# Patient Record
Sex: Female | Born: 1990 | Race: White | Hispanic: No | Marital: Married | Smoking: Never smoker
Health system: Southern US, Community
[De-identification: ages and names within clinical notes are randomized; demographics above are authoritative.]

## PROBLEM LIST (undated history)

## (undated) ENCOUNTER — Inpatient Hospital Stay (HOSPITAL_COMMUNITY): Payer: Self-pay

## (undated) DIAGNOSIS — F410 Panic disorder [episodic paroxysmal anxiety] without agoraphobia: Secondary | ICD-10-CM

## (undated) DIAGNOSIS — F411 Generalized anxiety disorder: Principal | ICD-10-CM

## (undated) DIAGNOSIS — F329 Major depressive disorder, single episode, unspecified: Secondary | ICD-10-CM

## (undated) HISTORY — PX: NO PAST SURGERIES: SHX2092

## (undated) HISTORY — DX: Panic disorder (episodic paroxysmal anxiety): F41.0

## (undated) HISTORY — DX: Major depressive disorder, single episode, unspecified: F32.9

## (undated) HISTORY — DX: Generalized anxiety disorder: F41.1

---
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
## 2011-12-10 ENCOUNTER — Ambulatory Visit (INDEPENDENT_AMBULATORY_CARE_PROVIDER_SITE_OTHER): Payer: BC Managed Care – PPO | Admitting: Family Medicine

## 2011-12-10 ENCOUNTER — Encounter: Payer: Self-pay | Admitting: Family Medicine

## 2011-12-10 VITALS — BP 122/78 | HR 95 | Temp 98.4°F | Wt 141.0 lb

## 2011-12-10 DIAGNOSIS — F410 Panic disorder [episodic paroxysmal anxiety] without agoraphobia: Secondary | ICD-10-CM

## 2011-12-10 DIAGNOSIS — R002 Palpitations: Secondary | ICD-10-CM

## 2011-12-10 LAB — TSH: TSH: 0.89 u[IU]/mL (ref 0.35–5.50)

## 2011-12-10 MED ORDER — PAROXETINE HCL 20 MG PO TABS: 20.0000 mg | ORAL_TABLET | ORAL | Status: DC

## 2011-12-10 NOTE — Patient Instructions (Signed)
Start taking the paxil and call back if not improved over the next few weeks.   I think counseling would be a good idea.  Ask about seeing Dr. Laymond Purser.  Go to the lab on the way out.   You can get your results through our phone system.  Follow the instructions on the blue card.

## 2011-12-10 NOTE — Progress Notes (Signed)
Episodes have happened since last year.  Was seen at ER prev, was thought to have panic episodes. She'll have episodic B hand tingling, hyperventilation, and heart racing. She'll feel light headed.  She had another episode this AM but it resolved.    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  She wants to go for BSN.  She's at [facility] now, living at home.  Her boyfriend has cancer.  She is feeling a lot of family pressure.  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  She had brief counseling for that episode.    No Si/Hi.    FH: Mother with h/o panic/anxiety sx.    PMH and SH reviewed  ROS: See HPI, otherwise noncontributory.  Meds, vitals, and allergies reviewed.   GEN: nad, alert and oriented HEENT: mucous membranes moist NECK: supple w/o LA CV: rrr.  no murmur PULM: ctab, no inc wob ABD: soft, +bs EXT: no edema SKIN: no acute rash Affect wnl.  Speech wnl.    EKG and TSH wnl.

## 2011-12-11 ENCOUNTER — Encounter: Payer: Self-pay | Admitting: Family Medicine

## 2011-12-11 NOTE — Assessment & Plan Note (Signed)
>  25 min spent with face to face with patient, >50% counseling and/or coordinating care.  EKG wnl, TSH wnl.  Panic is likely.  D/w pt about options.  Encouraged counseling and start paxil.  Routine instructions and timeline given, call back with update in next few weeks if needed.  She agrees.  Okay for outpatient fu.

## 2011-12-25 ENCOUNTER — Telehealth: Payer: Self-pay | Admitting: *Deleted

## 2011-12-25 NOTE — Telephone Encounter (Signed)
Agreed -

## 2011-12-25 NOTE — Telephone Encounter (Signed)
Patient came in today stating that her BP was elevated, I asked patient what are some of her BP readings and she stated that she doesn't have any she just feels that her BP is high.  Checked BP today and it is 142/82 pulse 76.  Spoke with Dr. Para March and he stated that he wants patient to check her BP over the next several days in the morning and call us back with readings.  Patient advised as instructed, she stated that she does have a BP cuff at home and will check her BP.

## 2011-12-26 ENCOUNTER — Ambulatory Visit: Payer: BC Managed Care – PPO | Admitting: Psychology

## 2012-01-09 ENCOUNTER — Ambulatory Visit (INDEPENDENT_AMBULATORY_CARE_PROVIDER_SITE_OTHER): Payer: BC Managed Care – PPO | Admitting: Psychology

## 2012-01-09 DIAGNOSIS — F411 Generalized anxiety disorder: Secondary | ICD-10-CM

## 2012-01-30 ENCOUNTER — Ambulatory Visit (INDEPENDENT_AMBULATORY_CARE_PROVIDER_SITE_OTHER): Payer: BC Managed Care – PPO | Admitting: Psychology

## 2012-01-30 DIAGNOSIS — F411 Generalized anxiety disorder: Secondary | ICD-10-CM

## 2012-01-31 ENCOUNTER — Encounter: Payer: Self-pay | Admitting: *Deleted

## 2012-02-13 ENCOUNTER — Ambulatory Visit: Payer: BC Managed Care – PPO | Admitting: Psychology

## 2012-02-20 ENCOUNTER — Ambulatory Visit: Payer: BC Managed Care – PPO | Admitting: Psychology

## 2012-02-27 ENCOUNTER — Ambulatory Visit (INDEPENDENT_AMBULATORY_CARE_PROVIDER_SITE_OTHER): Payer: BC Managed Care – PPO | Admitting: Psychology

## 2012-02-27 DIAGNOSIS — F411 Generalized anxiety disorder: Secondary | ICD-10-CM

## 2012-03-04 ENCOUNTER — Other Ambulatory Visit: Payer: BC Managed Care – PPO

## 2012-03-05 ENCOUNTER — Ambulatory Visit: Payer: BC Managed Care – PPO | Admitting: Psychology

## 2012-03-05 ENCOUNTER — Ambulatory Visit (INDEPENDENT_AMBULATORY_CARE_PROVIDER_SITE_OTHER): Payer: BC Managed Care – PPO | Admitting: *Deleted

## 2012-03-05 DIAGNOSIS — Z111 Encounter for screening for respiratory tuberculosis: Secondary | ICD-10-CM

## 2012-03-07 LAB — TB SKIN TEST
Induration: 0 mm
TB Skin Test: NEGATIVE

## 2012-03-19 ENCOUNTER — Ambulatory Visit (INDEPENDENT_AMBULATORY_CARE_PROVIDER_SITE_OTHER): Payer: BC Managed Care – PPO | Admitting: Psychology

## 2012-03-19 DIAGNOSIS — F411 Generalized anxiety disorder: Secondary | ICD-10-CM

## 2012-04-15 ENCOUNTER — Ambulatory Visit: Payer: BC Managed Care – PPO | Admitting: Psychology

## 2012-06-03 ENCOUNTER — Ambulatory Visit (INDEPENDENT_AMBULATORY_CARE_PROVIDER_SITE_OTHER): Payer: BC Managed Care – PPO | Admitting: Psychology

## 2012-06-03 DIAGNOSIS — F411 Generalized anxiety disorder: Secondary | ICD-10-CM

## 2012-06-17 ENCOUNTER — Ambulatory Visit (INDEPENDENT_AMBULATORY_CARE_PROVIDER_SITE_OTHER): Payer: BC Managed Care – PPO | Admitting: Psychology

## 2012-06-17 DIAGNOSIS — F411 Generalized anxiety disorder: Secondary | ICD-10-CM

## 2012-07-02 ENCOUNTER — Ambulatory Visit: Payer: BC Managed Care – PPO | Admitting: Psychology

## 2012-08-05 ENCOUNTER — Ambulatory Visit: Payer: Self-pay | Admitting: Psychology

## 2012-08-20 ENCOUNTER — Ambulatory Visit (INDEPENDENT_AMBULATORY_CARE_PROVIDER_SITE_OTHER): Payer: BC Managed Care – PPO | Admitting: Psychology

## 2012-08-20 DIAGNOSIS — F411 Generalized anxiety disorder: Secondary | ICD-10-CM

## 2012-09-16 ENCOUNTER — Ambulatory Visit: Payer: BC Managed Care – PPO | Admitting: Psychology

## 2012-09-25 ENCOUNTER — Encounter: Payer: Self-pay | Admitting: *Deleted

## 2012-09-25 ENCOUNTER — Encounter: Payer: Self-pay | Admitting: Family Medicine

## 2012-09-25 ENCOUNTER — Ambulatory Visit (INDEPENDENT_AMBULATORY_CARE_PROVIDER_SITE_OTHER): Payer: BC Managed Care – PPO | Admitting: Family Medicine

## 2012-09-25 VITALS — BP 118/70 | HR 76 | Temp 98.2°F | Wt 148.2 lb

## 2012-09-25 DIAGNOSIS — R51 Headache: Secondary | ICD-10-CM

## 2012-09-25 DIAGNOSIS — R519 Headache, unspecified: Secondary | ICD-10-CM | POA: Insufficient documentation

## 2012-09-25 MED ORDER — FLUTICASONE PROPIONATE 50 MCG/ACT NA SUSP: 2.0000 | Freq: Every day | NASAL | Status: DC

## 2012-09-25 NOTE — Patient Instructions (Addendum)
I do think this is related to sinus congestion - treat with nasal saline and nasal steroid (sent to pharmacy) and over the counter ibuprofen 400mg  2 times daily with food for 3-5 days. If not better with this, let us know.

## 2012-09-25 NOTE — Assessment & Plan Note (Signed)
nonfocal neurological exam. Could very well be side effect to paxil, however, anticipate more of a sinus pressure headache from sinus congestion/sinusitis. Given short duration of congestion sxs, anticipate viral sinusitis.  Discussed this. Treat with nasal saline, nasal steroid, and OTC ibuprofen for next 3-5 days. Discussed if HA continued despite this, to update [place], consider switch to another SSRI. Pt agrees with plan.

## 2012-09-25 NOTE — Progress Notes (Signed)
  Subjective:    Patient ID: Laura Gamble, female    DOB: 10/25/1990, 21 y.o.   MRN: [id number]  HPI CC: HA  Pleasant 21 yo with h/o panic attacks on paxil presents with 3 wk h/o HA, and 4d h/o worsening headaches described as bilateral frontal throbbing ache or pressure.  + photophobia.  No nausea/vomiting.  Go away on their own.  Relaxing in dark quiet area helps.  Doesn't tale any meds for HA.  Sometimes activity modifying headaches.  Not really worsening.  Doesn't awaken with them.  No h/o migraines in past.  Doesn't think headaches related to cycle.  Thinks staying well hydrated and getting plenty of sleep.  Drinks 2-3 cups caffeine /day but also a lot of water.  Denies fevers/chills, neck pain, vision changes.  Thinks paxil causing worsening migraines because seems to get HA whenever takes paxil.  Has been on paxil for last year.  Takes every other day (for last 4 months).  Anxiety attacks still present.  Fiance died in 04/29/2023 (cancer).  Pt currently in nursing school, working 2 jobs.  Today awoke with increased coughing, congestion.  + tooth pain.  No PNdrainage, ear pain. Mom recently with sinus infection. 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  Past Medical History  Diagnosis Date  . Panic attack     Review of Systems Per HPI    Objective:   Physical Exam  Nursing note and vitals reviewed. Constitutional: She appears well-developed and well-nourished. No distress.  HENT:  Head: Normocephalic and atraumatic.  Right Ear: Hearing, tympanic membrane, external ear and ear canal normal.  Left Ear: Hearing, tympanic membrane, external ear and ear canal normal.  Nose: No mucosal edema or rhinorrhea. Right sinus exhibits maxillary sinus tenderness (mild pressure). Right sinus exhibits no frontal sinus tenderness. Left sinus exhibits no maxillary sinus tenderness and no frontal sinus tenderness.  Mouth/Throat: Uvula is midline, oropharynx is clear and moist and mucous membranes are normal. No  oropharyngeal exudate, posterior oropharyngeal edema, posterior oropharyngeal erythema or tonsillar abscesses.  Eyes: Conjunctivae normal and EOM are normal. Pupils are equal, round, and reactive to light. No scleral icterus.  Neck: Normal range of motion. Neck supple.  Cardiovascular: Normal rate, regular rhythm, normal heart sounds and intact distal pulses.   No murmur heard. Pulmonary/Chest: Effort normal and breath sounds normal. No respiratory distress. She has no wheezes. She has no rales.  Lymphadenopathy:    She has no cervical adenopathy.  Neurological: No cranial nerve deficit or sensory deficit.       CN 2-12 intact Normal FTN No nystagmus  Skin: Skin is warm and dry. No rash noted.  Psychiatric: She has a normal mood and affect.       Assessment & Plan:

## 2012-10-14 ENCOUNTER — Ambulatory Visit: Payer: BC Managed Care – PPO | Admitting: Psychology

## 2012-10-21 ENCOUNTER — Ambulatory Visit: Payer: BC Managed Care – PPO

## 2012-10-24 ENCOUNTER — Ambulatory Visit (INDEPENDENT_AMBULATORY_CARE_PROVIDER_SITE_OTHER): Payer: BC Managed Care – PPO | Admitting: *Deleted

## 2012-10-24 DIAGNOSIS — Z23 Encounter for immunization: Secondary | ICD-10-CM

## 2012-11-13 ENCOUNTER — Encounter: Payer: Self-pay | Admitting: Family Medicine

## 2012-11-13 ENCOUNTER — Ambulatory Visit (INDEPENDENT_AMBULATORY_CARE_PROVIDER_SITE_OTHER): Payer: BC Managed Care – PPO | Admitting: Family Medicine

## 2012-11-13 ENCOUNTER — Ambulatory Visit: Payer: BC Managed Care – PPO | Admitting: Family Medicine

## 2012-11-13 VITALS — BP 120/60 | HR 101 | Temp 98.3°F | Ht 66.0 in | Wt 150.5 lb

## 2012-11-13 DIAGNOSIS — R599 Enlarged lymph nodes, unspecified: Secondary | ICD-10-CM

## 2012-11-13 DIAGNOSIS — R109 Unspecified abdominal pain: Secondary | ICD-10-CM

## 2012-11-13 DIAGNOSIS — R590 Localized enlarged lymph nodes: Secondary | ICD-10-CM

## 2012-11-13 LAB — POCT URINALYSIS DIPSTICK
Bilirubin, UA: NEGATIVE
Glucose, UA: NEGATIVE
Ketones, UA: NEGATIVE
Nitrite, UA: NEGATIVE
Protein, UA: NEGATIVE
Spec Grav, UA: 1.01
Urobilinogen, UA: NEGATIVE
pH, UA: 7.5

## 2012-11-13 LAB — POCT URINE PREGNANCY: Preg Test, Ur: NEGATIVE

## 2012-11-13 NOTE — Progress Notes (Signed)
Nature conservation officer at [facility] [address] [place] [address] Phone: [phone] Fax: [phone]  Date:  11/13/2012   Name:  Laura Gamble   DOB:  1991/06/24   MRN:  [id number] Gender: female Age: 22 y.o.  Primary Physician:  Hannah Beat, MD  Evaluating MD: Hannah Beat, MD   Chief Complaint: Abdominal Pain   History of Present Illness:  Laura Gamble is a 22 y.o. pleasant patient who presents with the following:  Started on Tuesday and was not all that bad, and yesterday it started to get worse. R LQ.Last night, pain was really bad. This morning it got really bad. No fever. Ate breakfast and lunch. Worked out yesterday. Had some nausea thi smorning and some last night. A little bit constipated. No fever. No chills,. No n/v/d. No bloody stool. No watery diarrhea.   LMP: 4 days ago. Ended days ago. 1 partner, no risk for STD. Boyfriend was virgin when started dating.   Patient Active Problem List  Diagnosis  . Contact Dermatitis and Other Eczema, due to Unspecified Cause  . Panic attack  . Headache    Past Medical History  Diagnosis Date  . Panic attack     No past surgical history on file.  History  Substance Use Topics  . Smoking status: Never Smoker   . Smokeless tobacco: Never Used  . Alcohol Use: No    Family History  Problem Relation Age of Onset  . Anxiety disorder Mother     No Known Allergies  Medication list has been reviewed and updated.  Outpatient Prescriptions Prior to Visit  Medication Sig Dispense Refill  . PARoxetine (PAXIL) 20 MG tablet Take 20 mg by mouth every other day.      . [DISCONTINUED] cimetidine (TAGAMET) 200 MG tablet Take 2 tablets by mouth daily.      . [DISCONTINUED] fluticasone (FLONASE) 50 MCG/ACT nasal spray Place 2 sprays into the nose daily.  16 g  1   Last reviewed on 11/13/2012  2:45 PM by Hannah Beat, MD  Review of Systems:  ROS: GEN: Acute illness details above GI: Tolerating PO  intake GU: maintaining adequate hydration and urination Pulm: No SOB Interactive and getting along well at home.  Otherwise, ROS is as per the HPI.   Physical Examination: BP 120/60  Pulse 101  Temp 98.3 F (36.8 C) (Oral)  Ht 5\' 6"  (1.676 m)  Wt 150 lb 8 oz (68.266 kg)  BMI 24.29 kg/m2  SpO2 99%  Ideal Body Weight: Weight in (lb) to have BMI = 25: 154.6   GEN: WDWN, NAD, Non-toxic, A & O x 3 HEENT: Atraumatic, Normocephalic. Neck supple. No masses, No LAD. Ears and Nose: No external deformity. CV: RRR, No M/G/R. No JVD. No thrill. No extra heart sounds. PULM: CTA B, no wheezes, crackles, rhonchi. No retractions. No resp. distress. No accessory muscle use. ABD: S, NT, ND, +BS. No rebound. No HSM. GU: Chaperoned exam. Normal introitus. Cervix nontender. Uterus and ovaries easily felt, and all nontender.  On the Right inguinal chain, there is an enlarged lymph node that the patient points to as the area of pain. Pubic area is shaven. EXTR: No c/c/e NEURO Normal gait.  PSYCH: Normally interactive. Conversant. Not depressed or anxious appearing.  Calm demeanor.    Assessment and Plan:  1. Lymphadenopathy, inguinal    2. Right sided abdominal pain  POCT Urinalysis Dipstick, POCT urine pregnancy   Reassured - pain is from  inguinal lymph node and should resolve in the next few weeks. Likely from ingrown hair post shaving. She recalls that it started the day after she last shaved.  If worsens and gets red, she is to call and I will put on ABX, but usually none are needed.  Results for orders placed in visit on 11/13/12  POCT URINALYSIS DIPSTICK      Component Value Range   Color, UA yellow     Clarity, UA cloudy     Glucose, UA neg     Bilirubin, UA neg     Ketones, UA neg     Spec Grav, UA 1.010     Blood, UA small     pH, UA 7.5     Protein, UA neg     Urobilinogen, UA negative     Nitrite, UA neg     Leukocytes, UA moderate (2+)    POCT URINE PREGNANCY       Component Value Range   Preg Test, Ur Negative       Orders Today:  Orders Placed This Encounter  Procedures  . POCT Urinalysis Dipstick  . POCT urine pregnancy    Updated Medication List: (Includes new medications, updates to list, dose adjustments) No orders of the defined types were placed in this encounter.    Medications Discontinued: Medications Discontinued During This Encounter  Medication Reason  . fluticasone (FLONASE) 50 MCG/ACT nasal spray Error  . cimetidine (TAGAMET) 200 MG tablet Error     Signed, Tandy Grawe T. Kaya Pottenger, MD 11/13/2012 2:45 PM

## 2013-02-18 ENCOUNTER — Encounter: Payer: BC Managed Care – PPO | Admitting: Family Medicine

## 2013-03-05 ENCOUNTER — Ambulatory Visit (INDEPENDENT_AMBULATORY_CARE_PROVIDER_SITE_OTHER): Payer: BC Managed Care – PPO | Admitting: Family Medicine

## 2013-03-05 ENCOUNTER — Encounter: Payer: Self-pay | Admitting: Family Medicine

## 2013-03-05 ENCOUNTER — Other Ambulatory Visit (HOSPITAL_COMMUNITY)
Admission: RE | Admit: 2013-03-05 | Discharge: 2013-03-05 | Disposition: A | Discharge status: Home / Self Care | Payer: BC Managed Care – PPO | Source: Ambulatory Visit | Attending: Family Medicine | Admitting: Family Medicine

## 2013-03-05 VITALS — BP 100/64 | HR 91 | Temp 98.1°F | Ht 66.0 in | Wt 145.0 lb

## 2013-03-05 DIAGNOSIS — Z113 Encounter for screening for infections with a predominantly sexual mode of transmission: Secondary | ICD-10-CM | POA: Insufficient documentation

## 2013-03-05 DIAGNOSIS — Z209 Contact with and (suspected) exposure to unspecified communicable disease: Secondary | ICD-10-CM

## 2013-03-05 DIAGNOSIS — Z0000 Encounter for general adult medical examination without abnormal findings: Secondary | ICD-10-CM

## 2013-03-05 DIAGNOSIS — Z01419 Encounter for gynecological examination (general) (routine) without abnormal findings: Secondary | ICD-10-CM | POA: Insufficient documentation

## 2013-03-05 NOTE — Progress Notes (Signed)
Nature conservation officer at [facility] [address] [place] [address] Phone: [phone] Fax: [phone]  Date:  03/05/2013   Name:  Laura Gamble   DOB:  07-01-1991   MRN:  [id number] Gender: female Age: 22 y.o.  Primary Physician:  Hannah Beat, MD  Evaluating MD: Hannah Beat, MD   Chief Complaint: Annual Exam   History of Present Illness:  Laura Gamble is a 22 y.o. pleasant patient who presents with the following:  CPX:   Cell: [phone]  Health Maintenance Summary Reviewed and updated, unless pt declines services.  Tobacco History Reviewed. Non-smoker Alcohol: No concerns, no excessive use Exercise Habits: works out a lot STD concerns: none Drug Use: None Birth control method: condoms Menses regular: yes Lumps or breast concerns: no Breast Cancer Family History: no   Patient Active Problem List   Diagnosis Date Noted  . Headache 09/25/2012  . Panic attack 12/10/2011  . Contact Dermatitis and Other Eczema, due to Unspecified Cause 02/14/2007    Past Medical History  Diagnosis Date  . Panic attack     No past surgical history on file.  History   Social History  . Marital Status: Single    Spouse Name: N/A    Number of Children: N/A  . Years of Education: N/A   Occupational History  . Not on file.   Social History Main Topics  . Smoking status: Never Smoker   . Smokeless tobacco: Never Used  . Alcohol Use: No  . Drug Use: No  . Sexually Active: Not on file   Other Topics Concern  . Not on file   Social History Narrative   student    Family History  Problem Relation Age of Onset  . Anxiety disorder Mother     No Known Allergies  Medication list has been reviewed and updated.  Outpatient Prescriptions Prior to Visit  Medication Sig Dispense Refill  . PARoxetine (PAXIL) 20 MG tablet Take 20 mg by mouth every other day.       No facility-administered medications prior to visit.    Review of  Systems:   General: Denies fever, chills, sweats. No significant weight loss. Eyes: Denies blurring,significant itching ENT: Denies earache, sore throat, and hoarseness.  Cardiovascular: Denies chest pains, palpitations, dyspnea on exertion,  Respiratory: Denies cough, dyspnea at rest,wheeezing Breast: no concerns about lumps GI: Denies nausea, vomiting, diarrhea, constipation, change in bowel habits, abdominal pain, melena, hematochezia GU: Denies dysuria, hematuria, urinary hesitancy, nocturia, denies STD risk, no concerns about discharge Musculoskeletal: Denies back pain, joint pain Derm: Denies rash, itching Neuro: Denies  paresthesias, frequent falls, frequent headaches Psych: Some anxiety, no big panic attacks. Stopped Paxil Endocrine: Denies cold intolerance, heat intolerance, polydipsia Heme: Denies enlarged lymph nodes Allergy: No hayfever   Physical Examination: BP 100/64  Pulse 91  Temp(Src) 98.1 F (36.7 C) (Oral)  Ht 5\' 6"  (1.676 m)  Wt 145 lb (65.772 kg)  BMI 23.41 kg/m2  SpO2 98%  LMP 02/07/2013  Ideal Body Weight: Weight in (lb) to have BMI = 25: 154.6   Wt Readings from Last 3 Encounters:  03/05/13 145 lb (65.772 kg)  11/13/12 150 lb 8 oz (68.266 kg)  09/25/12 148 lb 4 oz (67.246 kg)    GEN: well developed, well nourished, no acute distress Eyes: conjunctiva and lids normal, PERRLA, EOMI ENT: TM clear, nares clear, oral exam WNL Neck: supple, no lymphadenopathy, no thyromegaly, no JVD Pulm: clear to auscultation and percussion, respiratory  effort normal CV: regular rate and rhythm, S1-S2, no murmur, rub or gallop, no bruits Chest: no scars, masses, no lumps BREAST: no lumps, no axillary LAD, no nipple discharge GI: soft, non-tender; no hepatosplenomegaly, masses; active bowel sounds all quadrants GU: Normal external female genitalia. Cervix appears intact without lesions or irritation. Vaginal canal normal without ulceration or lesion. Cervix NT to  exam. Ovaries neither enlarged nor tender. Lymph: no cervical, axillary or inguinal adenopathy MSK: gait normal, muscle tone and strength WNL, no joint swelling, effusions, discoloration, crepitus  SKIN: clear, good turgor, color WNL, no rashes, lesions, or ulcerations Neuro: normal mental status, normal strength, sensation, and motion Psych: alert; oriented to person, place and time, normally interactive and not anxious or depressed in appearance.   Assessment and Plan:  Routine general medical examination at a health care facility  Encounter for routine gynecological examination - Plan: Cytology - PAP  Exposure to communicable disease - Plan: RPR, HIV antibody  The patient's preventative maintenance and recommended screening tests for an annual wellness exam were reviewed in full today. Brought up to date unless services declined.  Counselled on the importance of diet, exercise, and its role in overall health and mortality. The patient's FH and SH was reviewed, including their home life, tobacco status, and drug and alcohol status.   Doing well, check GC and CMZ today. Discussed anxiety - doing ok now  Orders Today:  Orders Placed This Encounter  Procedures  . RPR  . HIV antibody    Updated Medication List: (Includes new medications, updates to list, dose adjustments) No orders of the defined types were placed in this encounter.    Medications Discontinued: Medications Discontinued During This Encounter  Medication Reason  . PARoxetine (PAXIL) 20 MG tablet Error      Signed, Trendon Zaring T. Craig Ionescu, MD 03/05/2013 2:55 PM
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
## 2013-03-06 ENCOUNTER — Telehealth: Payer: Self-pay

## 2013-03-06 LAB — HIV ANTIBODY (ROUTINE TESTING W REFLEX): HIV: NONREACTIVE

## 2013-03-06 LAB — RPR

## 2013-03-06 NOTE — Telephone Encounter (Signed)
Pt request call back [phone] with lab results when available.

## 2013-03-06 NOTE — Telephone Encounter (Signed)
Will call patient when results available

## 2013-04-24 ENCOUNTER — Ambulatory Visit (INDEPENDENT_AMBULATORY_CARE_PROVIDER_SITE_OTHER): Payer: BC Managed Care – PPO | Admitting: Family Medicine

## 2013-04-24 ENCOUNTER — Encounter: Payer: Self-pay | Admitting: Family Medicine

## 2013-04-24 VITALS — BP 118/80 | HR 80 | Temp 97.9°F | Ht 66.0 in | Wt 146.0 lb

## 2013-04-24 DIAGNOSIS — J309 Allergic rhinitis, unspecified: Secondary | ICD-10-CM

## 2013-04-24 MED ORDER — AMOXICILLIN 875 MG PO TABS: 875.0000 mg | ORAL_TABLET | Freq: Two times a day (BID) | ORAL | Status: DC

## 2013-04-24 NOTE — Patient Instructions (Signed)
Good to see you. This is likely allergies- try allegra or claritin over the counter. If your symptoms worsen, ok to fill your prescription for amoxicillin.

## 2013-04-24 NOTE — Progress Notes (Signed)
SUBJECTIVE:  Laura Gamble is a 22 y.o. female who complains of coryza, congestion, sneezing and bilateral sinus pain for 3 days. She denies a history of anorexia, chest pain, chills and dizziness and denies a history of asthma. Patient denies smoke cigarettes.   Patient Active Problem List   Diagnosis Date Noted  . Headache 09/25/2012  . Panic attack 12/10/2011  . Contact Dermatitis and Other Eczema, due to Unspecified Cause 02/14/2007   Past Medical History  Diagnosis Date  . Panic attack    No past surgical history on file. History  Substance Use Topics  . Smoking status: Never Smoker   . Smokeless tobacco: Never Used  . Alcohol Use: No   Family History  Problem Relation Age of Onset  . Anxiety disorder Mother    No Known Allergies No current outpatient prescriptions on file prior to visit.   No current facility-administered medications on file prior to visit.   The PMH, PSH, Social History, Family History, Medications, and allergies have been reviewed in [facility], and have been updated if relevant.  OBJECTIVE: BP 118/80  Pulse 80  Temp(Src) 97.9 F (36.6 C)  Ht 5\' 6"  (1.676 m)  Wt 146 lb (66.225 kg)  BMI 23.58 kg/m2  She appears well, vital signs are as noted. Ears normal.  Throat and pharynx normal.  Neck supple. No adenopathy in the neck. Nose is congested. Sinuses non tender. The chest is clear, without wheezes or rales.  ASSESSMENT:  viral upper respiratory illness vs allergic rhinitis.  PLAN: Symptomatic therapy suggested: push fluids, rest and return office visit prn if symptoms persist or worsen. Lack of antibiotic effectiveness discussed with her. Call or return to clinic prn if these symptoms worsen or fail to improve as anticipated. Rx given for amoxicillin if symptoms worsen over the weekend. See AVS for details.

## 2013-04-27 ENCOUNTER — Encounter: Payer: Self-pay | Admitting: Family Medicine

## 2013-04-27 ENCOUNTER — Ambulatory Visit (INDEPENDENT_AMBULATORY_CARE_PROVIDER_SITE_OTHER): Payer: BC Managed Care – PPO | Admitting: Family Medicine

## 2013-04-27 VITALS — BP 124/78 | HR 76 | Temp 98.4°F | Ht 66.0 in | Wt 145.5 lb

## 2013-04-27 DIAGNOSIS — A088 Other specified intestinal infections: Secondary | ICD-10-CM

## 2013-04-27 DIAGNOSIS — R110 Nausea: Secondary | ICD-10-CM

## 2013-04-27 DIAGNOSIS — A084 Viral intestinal infection, unspecified: Secondary | ICD-10-CM

## 2013-04-27 DIAGNOSIS — F411 Generalized anxiety disorder: Secondary | ICD-10-CM

## 2013-04-27 DIAGNOSIS — F410 Panic disorder [episodic paroxysmal anxiety] without agoraphobia: Secondary | ICD-10-CM

## 2013-04-27 MED ORDER — ESCITALOPRAM OXALATE 10 MG PO TABS: 10.0000 mg | ORAL_TABLET | Freq: Every day | ORAL | Status: DC

## 2013-04-27 NOTE — Patient Instructions (Addendum)
F/u 4-5 weeks

## 2013-04-27 NOTE — Progress Notes (Signed)
Nature conservation officer at [facility] [address] [place] [address] Phone: [phone] Fax: [phone]  Date:  04/27/2013   Name:  Laura Gamble   DOB:  [birth year]   MRN:  [id number] Gender: female Age: 22 y.o.  Primary Physician:  Hannah Beat, MD  Evaluating MD: Hannah Beat, MD   Chief Complaint: Nausea   History of Present Illness:  Laura Gamble is a 22 y.o. pleasant patient who presents with the following:  Prob the worst she has ever felt, nausea in stomach and nervous knots in her stomach. Then heart will race and warm sensation in chest. Had some waves of it come. Feeling bad and sweaty.  Cannot sleep - anxiety.  Some diarrhea also, and about 4-5 times a day. Ongoing for about 4-5 days. Upset stomach, nausea over the last 4-5 days. Working at [employer].   04/14/2013. LMP  Patient Active Problem List   Diagnosis Date Noted  . Headache 09/25/2012  . Panic attack 12/10/2011  . Contact Dermatitis and Other Eczema, due to Unspecified Cause 02/14/2007    Past Medical History  Diagnosis Date  . Panic attack     No past surgical history on file.  History   Social History  . Marital Status: Single    Spouse Name: N/A    Number of Children: N/A  . Years of Education: N/A   Occupational History  . Not on file.   Social History Main Topics  . Smoking status: Never Smoker   . Smokeless tobacco: Never Used  . Alcohol Use: No  . Drug Use: No  . Sexually Active: Not on file   Other Topics Concern  . Not on file   Social History Narrative   student    Family History  Problem Relation Age of Onset  . Anxiety disorder Mother     No Known Allergies  Medication list has been reviewed and updated.  Outpatient Prescriptions Prior to Visit  Medication Sig Dispense Refill  . amoxicillin (AMOXIL) 875 MG tablet Take 1 tablet (875 mg total) by mouth 2 (two) times daily.  20 tablet  0   No facility-administered medications prior to visit.     Review of Systems:  ROS: GEN: Acute illness details above GI: Tolerating PO intake GU: maintaining adequate hydration and urination Pulm: No SOB Interactive and getting along well at home.  Otherwise, ROS is as per the HPI.   Physical Examination: BP 124/78  Pulse 76  Temp(Src) 98.4 F (36.9 C) (Oral)  Ht 5\' 6"  (1.676 m)  Wt 145 lb 8 oz (65.998 kg)  BMI 23.5 kg/m2  LMP 04/13/2013  Ideal Body Weight: Weight in (lb) to have BMI = 25: 154.6  GEN: WDWN, NAD, Non-toxic, A & O x 3 HEENT: Atraumatic, Normocephalic. Neck supple. No masses, No LAD. Ears and Nose: No external deformity. CV: RRR, No M/G/R. No JVD. No thrill. No extra heart sounds. PULM: CTA B, no wheezes, crackles, rhonchi. No retractions. No resp. distress. No accessory muscle use. ABD: S, NT, ND, hyperactive BS. No rebound. No HSM. EXTR: No c/c/e NEURO Normal gait.  PSYCH: Normally interactive. Conversant. Not depressed or anxious appearing.  Calm demeanor.    Assessment and Plan:  Generalized anxiety disorder  Panic attack  Nausea alone  Viral gastroenteritis  Supportive care for GI bug.  Patient known well. Clearly with anxiety - we had put her on paxil before and she has been through some counselling. Sister and mom also  have anxiety. Start lexapro when nausea starts to improve. Recheck with me in 4-5 weeks.  Orders Today:  No orders of the defined types were placed in this encounter.    Updated Medication List: (Includes new medications, updates to list, dose adjustments) Meds ordered this encounter  Medications  . Multiple Vitamin (MULTIVITAMIN) tablet    Sig: Take 1 tablet by mouth daily.  Marland Kitchen escitalopram (LEXAPRO) 10 MG tablet    Sig: Take 1 tablet (10 mg total) by mouth daily.    Dispense:  30 tablet    Refill:  3    Medications Discontinued: Medications Discontinued During This Encounter  Medication Reason  . amoxicillin (AMOXIL) 875 MG tablet Completed Course       Signed, Vonette Grosso T. Davonne Baby, MD 04/27/2013 11:06 AM

## 2013-04-28 ENCOUNTER — Encounter: Payer: Self-pay | Admitting: Family Medicine

## 2013-04-28 DIAGNOSIS — F411 Generalized anxiety disorder: Secondary | ICD-10-CM

## 2013-04-28 HISTORY — DX: Generalized anxiety disorder: F41.1

## 2013-05-04 ENCOUNTER — Ambulatory Visit: Payer: BC Managed Care – PPO | Admitting: Family Medicine

## 2013-05-05 ENCOUNTER — Ambulatory Visit (INDEPENDENT_AMBULATORY_CARE_PROVIDER_SITE_OTHER): Payer: BC Managed Care – PPO | Admitting: Psychology

## 2013-05-05 DIAGNOSIS — F411 Generalized anxiety disorder: Secondary | ICD-10-CM

## 2013-05-13 ENCOUNTER — Encounter: Payer: Self-pay | Admitting: Family Medicine

## 2013-05-27 ENCOUNTER — Ambulatory Visit (INDEPENDENT_AMBULATORY_CARE_PROVIDER_SITE_OTHER): Payer: BC Managed Care – PPO | Admitting: Psychology

## 2013-05-27 ENCOUNTER — Ambulatory Visit (INDEPENDENT_AMBULATORY_CARE_PROVIDER_SITE_OTHER): Payer: BC Managed Care – PPO | Admitting: Family Medicine

## 2013-05-27 ENCOUNTER — Encounter: Payer: Self-pay | Admitting: Family Medicine

## 2013-05-27 VITALS — BP 96/60 | HR 68 | Temp 98.6°F | Ht 66.0 in | Wt 145.2 lb

## 2013-05-27 DIAGNOSIS — F411 Generalized anxiety disorder: Secondary | ICD-10-CM

## 2013-05-27 NOTE — Progress Notes (Signed)
>  15 minutes spent in face to face time with patient, >50% spent in counselling or coordination of care: Feels like medicine is working, and other ddays will feel like she still has some symptoms but overall better. Taking 5 mg.  Decided to cut it after the first day and been on 5 mg for 1 mo. Also in counselling. ? Some PTSD from when her boyfriend passed of many years when he had cancer.   Also asks about "palpitations" which sound more like rare PVC, never lasting more than 1 beat.   GEN: WDWN, NAD, Non-toxic, A & O x 3 HEENT: Atraumatic, Normocephalic. Neck supple. No masses, No LAD. Ears and Nose: No external deformity. CV: RRR, No M/G/R. No JVD. No thrill. No extra heart sounds. PULM: CTA B, no wheezes, crackles, rhonchi. No retractions. No resp. distress. No accessory muscle use. EXTR: No c/c/e NEURO Normal gait.  PSYCH: Normally interactive. Conversant. Not depressed or anxious appearing.  Calm demeanor.   P: incr to 10 mg lexapro. Cont counselling and exercise.

## 2013-06-18 ENCOUNTER — Ambulatory Visit: Payer: BC Managed Care – PPO | Admitting: Psychology

## 2013-06-26 ENCOUNTER — Encounter: Payer: Self-pay | Admitting: Family Medicine

## 2013-06-26 ENCOUNTER — Ambulatory Visit (INDEPENDENT_AMBULATORY_CARE_PROVIDER_SITE_OTHER): Payer: BC Managed Care – PPO | Admitting: Family Medicine

## 2013-06-26 ENCOUNTER — Telehealth: Payer: Self-pay

## 2013-06-26 VITALS — BP 128/80 | HR 86 | Temp 98.4°F | Ht 68.0 in | Wt 148.8 lb

## 2013-06-26 DIAGNOSIS — M26609 Unspecified temporomandibular joint disorder, unspecified side: Secondary | ICD-10-CM

## 2013-06-26 DIAGNOSIS — L259 Unspecified contact dermatitis, unspecified cause: Secondary | ICD-10-CM

## 2013-06-26 DIAGNOSIS — R220 Localized swelling, mass and lump, head: Secondary | ICD-10-CM | POA: Insufficient documentation

## 2013-06-26 DIAGNOSIS — K1120 Sialoadenitis, unspecified: Secondary | ICD-10-CM

## 2013-06-26 LAB — CBC WITH DIFFERENTIAL/PLATELET
Basophils Absolute: 0 10*3/uL (ref 0.0–0.1)
Basophils Relative: 0.7 % (ref 0.0–3.0)
Eosinophils Absolute: 0.1 10*3/uL (ref 0.0–0.7)
Eosinophils Relative: 1.5 % (ref 0.0–5.0)
HCT: 37.4 % (ref 36.0–46.0)
Hemoglobin: 12.7 g/dL (ref 12.0–15.0)
Lymphocytes Relative: 30.6 % (ref 12.0–46.0)
Lymphs Abs: 1.6 10*3/uL (ref 0.7–4.0)
MCHC: 34 g/dL (ref 30.0–36.0)
MCV: 86 fl (ref 78.0–100.0)
Monocytes Absolute: 0.4 10*3/uL (ref 0.1–1.0)
Monocytes Relative: 8.2 % (ref 3.0–12.0)
Neutro Abs: 3.1 10*3/uL (ref 1.4–7.7)
Neutrophils Relative %: 59 % (ref 43.0–77.0)
Platelets: 269 10*3/uL (ref 150.0–400.0)
RBC: 4.36 Mil/uL (ref 3.87–5.11)
RDW: 13 % (ref 11.5–14.6)
WBC: 5.3 10*3/uL (ref 4.5–10.5)

## 2013-06-26 LAB — MONONUCLEOSIS SCREEN: Mono Screen: NEGATIVE

## 2013-06-26 MED ORDER — CLINDAMYCIN HCL 300 MG PO CAPS: 300.0000 mg | ORAL_CAPSULE | Freq: Three times a day (TID) | ORAL | Status: DC

## 2013-06-26 MED ORDER — METHYLPREDNISOLONE ACETATE 40 MG/ML IJ SUSP
40.0000 mg | Freq: Once | INTRAMUSCULAR | Status: AC
  Administered 2013-06-26: 40 mg via INTRAMUSCULAR

## 2013-06-26 NOTE — Telephone Encounter (Signed)
Pt seen urgently by myself in office.

## 2013-06-26 NOTE — Addendum Note (Signed)
Addended by: Sueanne Margarita on: 06/26/2013 02:30 PM   Modules accepted: Orders

## 2013-06-26 NOTE — Patient Instructions (Addendum)
Stop by lab on way out. I will call with results. Start antibiotics three times a day. Follow up early next week with primary MD  or  Dr.Bedsole. Go to ER if swelling increasing or if shortness of breath.

## 2013-06-26 NOTE — Assessment & Plan Note (Signed)
Viral vs bacterial. No fever, no sepsis symptoms. Will eval with labs to eval for EBV, mumps, wbc. Will start empirically on clindamycin.

## 2013-06-26 NOTE — Progress Notes (Signed)
Subjective:    Patient ID: Laura Gamble, female    DOB: 1991/08/06, 21 y.o.   MRN: [id number]  HPI  22 year old female pt of Dr. Cyndie Chime presented as walk in with her grandmother following new onset  swelling in her face, started 30 min ago. It started after eating 10 min after starting... She noted more and more difficulty chewing and swallowing. Noted swelling and redness in left cheek, chest and face red. No lip swelling. Had sprite, salad and soup (sausage potatos and kale), bread sticks. She has never had this before.  She noted redness on chest, heat on chest... She does get anxious and has redness on chest when she is anxious.  No previous allergic reactions.  Last night she did have some upper stomach pain and episode of emesis last night. She has flet a little queasy all day and this was her first meal of the day.  She has also had a headache in last few days, mild to moderate.    In the last 15 minutes the swelling has gone down some. Currently, mild difficulty swallowing, but improving. No SOB, no cough.  3 months ago she started on lexapro. No SE.  No OTCs.    Review of Systems  Constitutional: Positive for fatigue. Negative for fever.  HENT: Negative for ear pain.   Eyes: Negative for pain.  Respiratory: Negative for cough, choking, chest tightness, shortness of breath, wheezing and stridor.   Cardiovascular: Negative for chest pain, palpitations and leg swelling.  Gastrointestinal: Negative for abdominal pain.  Genitourinary: Negative for dysuria.       Objective:   Physical Exam  Constitutional: Vital signs are normal. She appears well-developed and well-nourished. She is cooperative.  Non-toxic appearance. She does not appear ill. No distress.  HENT:  Head: Normocephalic.  Right Ear: Hearing, tympanic membrane, external ear and ear canal normal. Tympanic membrane is not erythematous, not retracted and not bulging.  Left Ear: Hearing, tympanic membrane,  external ear and ear canal normal. Tympanic membrane is not erythematous, not retracted and not bulging.  Nose: No mucosal edema or rhinorrhea. Right sinus exhibits no maxillary sinus tenderness and no frontal sinus tenderness. Left sinus exhibits no maxillary sinus tenderness and no frontal sinus tenderness.  Mouth/Throat: Uvula is midline, oropharynx is clear and moist and mucous membranes are normal. No oropharyngeal exudate, posterior oropharyngeal edema, posterior oropharyngeal erythema or tonsillar abscesses.  Tenderness and significant swelling in left parotid gland focally, small amount of tenderness and swelling in right parotid  Eyes: Conjunctivae, EOM and lids are normal. Pupils are equal, round, and reactive to light. Lids are everted and swept, no foreign bodies found.  Neck: Trachea normal and normal range of motion. Neck supple. Carotid bruit is not present. No mass and no thyromegaly present.  Cardiovascular: Normal rate, regular rhythm, S1 normal, S2 normal, normal heart sounds, intact distal pulses and normal pulses.  Exam reveals no gallop and no friction rub.   No murmur heard. Pulmonary/Chest: Effort normal and breath sounds normal. Not tachypneic. No respiratory distress. She has no decreased breath sounds. She has no wheezes. She has no rhonchi. She has no rales.  Abdominal: Soft. Normal appearance and bowel sounds are normal. There is no tenderness.  Neurological: She is alert.  Skin: Skin is warm, dry and intact. Rash noted. No purpura noted. Rash is not macular, not papular, not maculopapular, not nodular, not pustular, not vesicular and not urticarial.  Redness on chest, flushing, warmth  Psychiatric: Her speech is normal and behavior is normal. Judgment and thought content normal. Her mood appears not anxious. Cognition and memory are normal. She does not exhibit a depressed mood.          Assessment & Plan:

## 2013-06-26 NOTE — Assessment & Plan Note (Signed)
Most consistent with parotid infeciton but given temporal assocaiteion with eating and possible allergic reaction. Will provide steroid injection x 1.

## 2013-06-26 NOTE — Telephone Encounter (Signed)
30 mins ago was eating at [employer] and problem swallowing and chewing, lt side of face swollen, rash on chest. No SOB and no difficulty breathing. Pt said feels better than when came to office.Swelling of face and redness of chest has lessened.

## 2013-06-27 LAB — MUMPS ANTIBODY, IGG: Mumps IgG: 66.6 AU/mL — ABNORMAL HIGH (ref <9.00)

## 2013-06-27 LAB — CYTOMEGALOVIRUS ANTIBODY, IGG: Cytomegalovirus Ab-IgG: >10 U/mL — ABNORMAL HIGH (ref <0.60)

## 2013-06-27 LAB — CMV IGM: CMV IgM: <8 AU/mL (ref <30.00)

## 2013-06-30 LAB — MUMPS ANTIBODY, IGM: Mumps IgM Value: <1:20 {titer}

## 2013-07-01 ENCOUNTER — Ambulatory Visit (INDEPENDENT_AMBULATORY_CARE_PROVIDER_SITE_OTHER): Payer: BC Managed Care – PPO | Admitting: Family Medicine

## 2013-07-01 ENCOUNTER — Encounter: Payer: Self-pay | Admitting: Family Medicine

## 2013-07-01 VITALS — BP 110/72 | HR 57 | Temp 98.2°F | Ht 68.0 in | Wt 147.5 lb

## 2013-07-01 DIAGNOSIS — F339 Major depressive disorder, recurrent, unspecified: Secondary | ICD-10-CM | POA: Insufficient documentation

## 2013-07-01 DIAGNOSIS — F410 Panic disorder [episodic paroxysmal anxiety] without agoraphobia: Secondary | ICD-10-CM

## 2013-07-01 DIAGNOSIS — F32A Depression, unspecified: Secondary | ICD-10-CM

## 2013-07-01 DIAGNOSIS — K1120 Sialoadenitis, unspecified: Secondary | ICD-10-CM

## 2013-07-01 DIAGNOSIS — F411 Generalized anxiety disorder: Secondary | ICD-10-CM

## 2013-07-01 DIAGNOSIS — F3289 Other specified depressive episodes: Secondary | ICD-10-CM

## 2013-07-01 DIAGNOSIS — F329 Major depressive disorder, single episode, unspecified: Secondary | ICD-10-CM

## 2013-07-01 HISTORY — DX: Depression, unspecified: F32.A

## 2013-07-01 MED ORDER — ESCITALOPRAM OXALATE 20 MG PO TABS: 20.0000 mg | ORAL_TABLET | Freq: Every day | ORAL | Status: DC

## 2013-07-01 NOTE — Progress Notes (Signed)
Nature conservation officer at [facility] [address] [place] [address] Phone: [phone] Fax: [phone]  Date:  07/01/2013   Name:  Laura Gamble   DOB:  06/10/1991   MRN:  [id number] Gender: female Age: 22 y.o.  Primary Physician:  Hannah Beat, MD  Evaluating MD: Hannah Beat, MD  Chief Complaint: Follow-up   History of Present Illness:  Laura Gamble is a 22 y.o. very pleasant female patient who presents with the following:  Left sided parotid gland:  The patient had some swelling in the left side of her jaw, and she saw my partner Dr. Ermalene Searing last week. She was placed on Cleocin, and this is since cleared up nicely. She had some continued swelling for about 2-3 days after her last office visit.  We also talked more about her state of mental health. She does appear more depressed. She notes that she is more depressed. She is feeling guilty. She has a lack of interest and desire to do things. She has been crying intermittently without any real reason to do so. She is dropped out of school. She does not feel homicidal or suicidal.  Past Medical History, Surgical History, Social History, Family History, Problem List, Medications, and Allergies have been reviewed and updated if relevant.  Current Outpatient Prescriptions on File Prior to Visit  Medication Sig Dispense Refill  . clindamycin (CLEOCIN) 300 MG capsule Take 1 capsule (300 mg total) by mouth 3 (three) times daily.  30 capsule  0  . escitalopram (LEXAPRO) 10 MG tablet Take 1 tablet (10 mg total) by mouth daily.  30 tablet  3  . Multiple Vitamin (MULTIVITAMIN) tablet Take 1 tablet by mouth daily.       No current facility-administered medications on file prior to visit.    Review of Systems: As above. No current fever, chills, or sweats. Psychiatric changes as noted.  Physical Examination: BP 110/72  Pulse 57  Temp(Src) 98.2 F (36.8 C) (Oral)  Ht 5\' 8"  (1.727 m)  Wt 147 lb 8 oz (66.906 kg)  BMI  22.43 kg/m2  LMP 06/15/2013   GEN: WDWN, NAD, Non-toxic, A & O x 3 HEENT: Atraumatic, Normocephalic. Neck supple. No masses, No LAD. Nontender around the corner of the jaw. Ears and Nose: No external deformity. CV: RRR, No M/G/R. No JVD. No thrill. No extra heart sounds. PULM: CTA B, no wheezes, crackles, rhonchi. No retractions. No resp. distress. No accessory muscle use. EXTR: No c/c/e NEURO Normal gait.  PSYCH: The withdrawn than typical. Moderately flattened affect.  Assessment and Plan:  Parotitis  Depression  Panic attack  Generalized anxiety disorder  No concern regarding her parotiditis.  I think that her depression has worsened. I am going to try to increase her Lexapro to 20 mg a day. Also encouraged her to continue exercising routinely, and she was a high school runner.  Orders Today:  No orders of the defined types were placed in this encounter.    Updated Medication List: (Includes new medications, updates to list, dose adjustments) Meds ordered this encounter  Medications  . escitalopram (LEXAPRO) 20 MG tablet    Sig: Take 1 tablet (20 mg total) by mouth daily.    Dispense:  30 tablet    Refill:  3    Medications Discontinued: Medications Discontinued During This Encounter  Medication Reason  . escitalopram (LEXAPRO) 10 MG tablet Reorder       Signed, Karleen Hampshire T. Arretta Toenjes, MD 07/01/2013 11:24 AM

## 2013-07-01 NOTE — Patient Instructions (Signed)
F/u 5-6 weeks

## 2013-08-03 ENCOUNTER — Encounter: Payer: Self-pay | Admitting: Family Medicine

## 2013-08-03 ENCOUNTER — Ambulatory Visit (INDEPENDENT_AMBULATORY_CARE_PROVIDER_SITE_OTHER): Payer: BC Managed Care – PPO | Admitting: Family Medicine

## 2013-08-03 VITALS — BP 120/70 | HR 69 | Temp 98.5°F | Ht 68.0 in | Wt 149.8 lb

## 2013-08-03 DIAGNOSIS — R400 Somnolence: Secondary | ICD-10-CM

## 2013-08-03 DIAGNOSIS — N912 Amenorrhea, unspecified: Secondary | ICD-10-CM

## 2013-08-03 DIAGNOSIS — G43009 Migraine without aura, not intractable, without status migrainosus: Secondary | ICD-10-CM

## 2013-08-03 DIAGNOSIS — F411 Generalized anxiety disorder: Secondary | ICD-10-CM

## 2013-08-03 DIAGNOSIS — F32A Depression, unspecified: Secondary | ICD-10-CM

## 2013-08-03 DIAGNOSIS — F3289 Other specified depressive episodes: Secondary | ICD-10-CM

## 2013-08-03 DIAGNOSIS — F329 Major depressive disorder, single episode, unspecified: Secondary | ICD-10-CM

## 2013-08-03 DIAGNOSIS — R404 Transient alteration of awareness: Secondary | ICD-10-CM

## 2013-08-03 DIAGNOSIS — R531 Weakness: Secondary | ICD-10-CM

## 2013-08-03 DIAGNOSIS — R5381 Other malaise: Secondary | ICD-10-CM

## 2013-08-03 LAB — POCT URINE PREGNANCY: Preg Test, Ur: NEGATIVE

## 2013-08-03 MED ORDER — AMITRIPTYLINE HCL 10 MG PO TABS: 10.0000 mg | ORAL_TABLET | Freq: Every day | ORAL | Status: DC

## 2013-08-03 NOTE — Progress Notes (Signed)
Date:  08/03/2013   Name:  Laura Gamble   DOB:  June 12, 1991   MRN:  [id number] Gender: female Age: 22 y.o.  Primary Physician:  Hannah Beat, MD   Chief Complaint: decrease appetite, Fatigue and Nausea   History of Present Illness:  Laura Gamble is a 22 y.o. very pleasant female patient who presents with the following:  Fatigue, nausea, tired and weak and feels really bad.  Period was 2 weeks late - 20th - 24th.  Lashing out some.  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   Nausea bad in the morning.  No breast tenderness.   Has had some GI tract pain and symptoms.  Some HA with light and driving. Loud sound? Bothers her.  No visuals  Still depressed some, but doing a little bit better than before. Not sleeping well. Stressed about work and moving.  Past Medical History, Surgical History, Social History, Family History, Problem List, Medications, and Allergies have been reviewed and updated if relevant.  Current Outpatient Prescriptions on File Prior to Visit  Medication Sig Dispense Refill  . escitalopram (LEXAPRO) 20 MG tablet Take 1 tablet (20 mg total) by mouth daily.  30 tablet  3  . Multiple Vitamin (MULTIVITAMIN) tablet Take 1 tablet by mouth daily.       No current facility-administered medications on file prior to visit.    Review of Systems:  GEN: No acute illnesses, no fevers, chills. GI: No n/v/d, eating normally Pulm: No SOB Interactive and getting along well at home.  Otherwise, ROS is as per the HPI.   Physical Examination: BP 120/70  Pulse 69  Temp(Src) 98.5 F (36.9 C) (Oral)  Ht 5\' 8"  (1.727 m)  Wt 149 lb 12 oz (67.926 kg)  BMI 22.77 kg/m2  LMP 07/27/2013   GEN: WDWN, NAD, Non-toxic, A & O x 3 HEENT: Atraumatic, Normocephalic. Neck supple. No masses, No LAD. Ears and Nose: No external deformity. CV: RRR, No M/G/R. No JVD. No thrill. No extra heart sounds. PULM: CTA B, no wheezes, crackles, rhonchi. No retractions. No resp. distress.  No accessory muscle use. EXTR: No c/c/e NEURO Normal gait.  PSYCH: Normally interactive. Conversant. Not depressed or anxious appearing.  Calm demeanor.    Assessment and Plan:  Common migraine  Absence of menstruation - Plan: POCT urine pregnancy  Drowsy  Weak  Depression  Generalized anxiety disorder  I suspect multi-factorial with depression and stress playing a big role with her not feeling well, but I think she is improving overall.   She describes ha with photo and phonphobia - suspicious for migraine. I am going to start low dose elavil to try to help all of this and sleep.  Results for orders placed in visit on 08/03/13  POCT URINE PREGNANCY      Result Value Range   Preg Test, Ur Negative      Orders Today:  Orders Placed This Encounter  Procedures  . POCT urine pregnancy    Updated Medication List: (Includes new medications, updates to list, dose adjustments) Meds ordered this encounter  Medications  . amitriptyline (ELAVIL) 10 MG tablet    Sig: Take 1 tablet (10 mg total) by mouth at bedtime.    Dispense:  30 tablet    Refill:  5    Medications Discontinued: Medications Discontinued During This Encounter  Medication Reason  . clindamycin (CLEOCIN) 300 MG capsule Completed Course      Signed,  Jania Steinke T. Shaye Elling, MD, CAQ Sports Medicine  Nature conservation officer at [facility] [address] [place] [address] Phone: [phone] Fax: [phone]

## 2013-08-04 ENCOUNTER — Telehealth: Payer: Self-pay | Admitting: Family Medicine

## 2013-08-04 DIAGNOSIS — G43009 Migraine without aura, not intractable, without status migrainosus: Secondary | ICD-10-CM | POA: Insufficient documentation

## 2013-08-04 NOTE — Telephone Encounter (Signed)
Patient Information:  Caller Name: [facility]  Phone: [phone]  Patient: Laura Gamble  Gender: Female  DOB: January 04, 1991  Age: 22 Years  PCP: Hannah Beat (Family Practice)  Pregnant: No  Office Follow Up:  Does the office need to follow up with this patient?: No  Instructions For The Office: N/Gamble   Symptoms  Reason For Call & Symptoms: Patient calling about color of her stools has changed to bright green; she is concerned about her liver.  Advised that green is Gamble normal variation of stool color and the colors that are worrysome are red, black or white / clay colored.  She voiced understanding.  No Protocol Available - Information Only guideline.  Reviewed Health History In EMR: Yes  Reviewed Medications In EMR: Yes  Reviewed Allergies In EMR: Yes  Reviewed Surgeries / Procedures: Yes  Date of Onset of Symptoms: 08/04/2013 OB / GYN:  LMP: 07/31/2013  Guideline(s) Used:  No Protocol Available - Information Only  Disposition Per Guideline:   Home Care  Reason For Disposition Reached:   Information only question and nurse able to answer  Advice Given:  Call Back If:  New symptoms develop  You become worse.  Patient Will Follow Care Advice:  YES

## 2013-08-06 ENCOUNTER — Telehealth: Payer: Self-pay

## 2013-08-06 NOTE — Telephone Encounter (Signed)
Pt left v/m requesting copy of most recent PPD; last PPD on 03/05/2012. Left v/m for pt to cb to verify if that date is OK.

## 2013-08-07 NOTE — Telephone Encounter (Signed)
Pt will let Frances Furbish know when last PPD was and will call back if needed.

## 2013-08-13 ENCOUNTER — Other Ambulatory Visit: Payer: Self-pay

## 2013-08-31 ENCOUNTER — Ambulatory Visit (INDEPENDENT_AMBULATORY_CARE_PROVIDER_SITE_OTHER): Payer: BC Managed Care – PPO | Admitting: Family Medicine

## 2013-08-31 ENCOUNTER — Encounter: Payer: Self-pay | Admitting: Family Medicine

## 2013-08-31 VITALS — BP 100/60 | HR 63 | Temp 98.2°F | Ht 68.0 in | Wt 151.5 lb

## 2013-08-31 DIAGNOSIS — R5381 Other malaise: Secondary | ICD-10-CM

## 2013-08-31 DIAGNOSIS — F329 Major depressive disorder, single episode, unspecified: Secondary | ICD-10-CM

## 2013-08-31 DIAGNOSIS — F411 Generalized anxiety disorder: Secondary | ICD-10-CM

## 2013-08-31 DIAGNOSIS — G43009 Migraine without aura, not intractable, without status migrainosus: Secondary | ICD-10-CM

## 2013-08-31 DIAGNOSIS — F410 Panic disorder [episodic paroxysmal anxiety] without agoraphobia: Secondary | ICD-10-CM

## 2013-08-31 DIAGNOSIS — F339 Major depressive disorder, recurrent, unspecified: Secondary | ICD-10-CM

## 2013-08-31 LAB — CBC WITH DIFFERENTIAL/PLATELET
Basophils Absolute: 0 10*3/uL (ref 0.0–0.1)
Basophils Relative: 0.6 % (ref 0.0–3.0)
Eosinophils Absolute: 0.1 10*3/uL (ref 0.0–0.7)
Eosinophils Relative: 2.1 % (ref 0.0–5.0)
HCT: 37.4 % (ref 36.0–46.0)
Hemoglobin: 12.7 g/dL (ref 12.0–15.0)
Lymphocytes Relative: 29 % (ref 12.0–46.0)
Lymphs Abs: 1.4 10*3/uL (ref 0.7–4.0)
MCHC: 34.1 g/dL (ref 30.0–36.0)
MCV: 86.4 fl (ref 78.0–100.0)
Monocytes Absolute: 0.5 10*3/uL (ref 0.1–1.0)
Monocytes Relative: 9.2 % (ref 3.0–12.0)
Neutro Abs: 2.9 10*3/uL (ref 1.4–7.7)
Neutrophils Relative %: 59.1 % (ref 43.0–77.0)
Platelets: 277 10*3/uL (ref 150.0–400.0)
RBC: 4.33 Mil/uL (ref 3.87–5.11)
RDW: 13.3 % (ref 11.5–14.6)
WBC: 4.9 10*3/uL (ref 4.5–10.5)

## 2013-08-31 LAB — BASIC METABOLIC PANEL
BUN: 9 mg/dL (ref 6–23)
CO2: 28 mEq/L (ref 19–32)
Calcium: 9.3 mg/dL (ref 8.4–10.5)
Chloride: 103 mEq/L (ref 96–112)
Creatinine, Ser: 0.6 mg/dL (ref 0.4–1.2)
GFR: 123.87 mL/min (ref >60.00)
Glucose, Bld: 78 mg/dL (ref 70–99)
Potassium: 4.1 mEq/L (ref 3.5–5.1)
Sodium: 138 mEq/L (ref 135–145)

## 2013-08-31 LAB — HEPATIC FUNCTION PANEL
ALT: 20 U/L (ref 0–35)
AST: 23 U/L (ref 0–37)
Albumin: 4.2 g/dL (ref 3.5–5.2)
Alkaline Phosphatase: 49 U/L (ref 39–117)
Bilirubin, Direct: 0 mg/dL (ref 0.0–0.3)
Total Bilirubin: 0.8 mg/dL (ref 0.3–1.2)
Total Protein: 7.5 g/dL (ref 6.0–8.3)

## 2013-08-31 LAB — TSH: TSH: 0.5 u[IU]/mL (ref 0.35–5.50)

## 2013-08-31 MED ORDER — PROPRANOLOL HCL ER 80 MG PO CP24: 80.0000 mg | ORAL_CAPSULE | Freq: Every day | ORAL | Status: DC

## 2013-08-31 MED ORDER — BUPROPION HCL ER (XL) 150 MG PO TB24: 150.0000 mg | ORAL_TABLET | Freq: Every day | ORAL | Status: DC

## 2013-08-31 NOTE — Progress Notes (Signed)
Date:  08/31/2013   Name:  Laura Gamble   DOB:  08-07-1991   MRN:  [id number] Gender: female Age: 22 y.o.  Primary Physician:  Hannah Beat, MD   Chief Complaint: Discuss Medicaiton   Subjective:   History of Present Illness:  Laura Gamble is a 22 y.o. pleasant patient who presents with the following:  Angry, lashing out. Does not talk to anyone about it. Feels like it is worst than it has ever been. Was having some pain in her abdomen, and thought maybe her liver related. Has a lot of nervousness.   Decreased drive to do much of anything. She is also been lashing out and arguing a lot more with her family and her boyfriend.  Will get 9-10 hours. Feels tired and exhausted. Having trouble.  Interest in activities. Not talking.  Has boyfriend.  Some guilty feelings, guilt, anger, dark thoughts.   Her patient is terminal. He is mean. 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  Feels like she is drifting apart from everything and self-esteem is really low.    Patient Active Problem List   Diagnosis Date Noted  . Common migraine 08/04/2013  . Major depression, recurrent 07/01/2013  . Generalized anxiety disorder 04/28/2013  . Headache 09/25/2012  . Panic attack 12/10/2011  . Contact Dermatitis and Other Eczema, due to Unspecified Cause 02/14/2007    Past Medical History  Diagnosis Date  . Panic attack   . Generalized anxiety disorder 04/28/2013  . Depression 07/01/2013    No past surgical history on file.  History   Social History  . Marital Status: Single    Spouse Name: N/A    Number of Children: N/A  . Years of Education: N/A   Occupational History  . Not on file.   Social History Main Topics  . Smoking status: Never Smoker   . Smokeless tobacco: Never Used  . Alcohol Use: No  . Drug Use: No  . Sexual Activity: Not on file   Other Topics Concern  . Not on file   Social History Narrative   student    Family History  Problem Relation Age of  Onset  . Anxiety disorder Mother     No Known Allergies  Medication list has been reviewed and updated.  Review of Systems:  As above No fevers, chills, sweats or chest pain.  Objective:   Physical Examination: BP 100/60  Pulse 63  Temp(Src) 98.2 F (36.8 C) (Oral)  Ht 5\' 8"  (1.727 m)  Wt 151 lb 8 oz (68.72 kg)  BMI 23.04 kg/m2  LMP 08/18/2013  Ideal Body Weight: Weight in (lb) to have BMI = 25: 164.1   GEN: WDWN, NAD, Non-toxic, A & O x 3 HEENT: Atraumatic, Normocephalic. Neck supple. No masses, No LAD. Ears and Nose: No external deformity. CV: RRR, No M/G/R. No JVD. No thrill. No extra heart sounds. PULM: CTA B, no wheezes, crackles, rhonchi. No retractions. No resp. distress. No accessory muscle use. EXTR: No c/c/e NEURO Normal gait.  PSYCH: Normally interactive. Conversant. Not depressed or anxious appearing.  Calm demeanor.   Laboratory and Imaging Data:  Assessment & Plan:    Major depression, recurrent  Major depression - Plan: CBC with Differential, Basic metabolic panel, Hepatic function panel, TSH  Other malaise and fatigue - Plan: CBC with Differential, Basic metabolic panel, Hepatic function panel, TSH  Generalized anxiety disorder  Panic attack  Common migraine  I am concerned. The patient's  depression has gotten significantly worse. She is not currently suicidal. She decreased her Lexapro 10 mg. I am going to have her increase this to 20 mg again. I would like to start some Wellbutrin 150 mg as well. Additionally, I think it may be helpful from an anxiety standpoint to start some Inderal LA, which also would have the added benefit of potentially helping with her migraine headaches. Her migraines also worsened in the last 2 months.  Patient Instructions  F/u 1 month   Orders Today:  Orders Placed This Encounter  Procedures  . CBC with Differential  . Basic metabolic panel  . Hepatic function panel  . TSH    New medications, updates to  list, dose adjustments: Meds ordered this encounter  Medications  . buPROPion (WELLBUTRIN XL) 150 MG 24 hr tablet    Sig: Take 1 tablet (150 mg total) by mouth daily.    Dispense:  30 tablet    Refill:  3  . propranolol ER (INDERAL LA) 80 MG 24 hr capsule    Sig: Take 1 capsule (80 mg total) by mouth daily.    Dispense:  30 capsule    Refill:  3    Signed,  Tonilynn Bieker T. Semir Brill, MD, CAQ Sports Medicine  [facility] at [facility] [address] [place] [address] Phone: [phone] Fax: [phone]  Updated Complete Medication List:   Medication List       This list is accurate as of: 08/31/13  6:16 PM.  Always use your most recent med list.               buPROPion 150 MG 24 hr tablet  Commonly known as:  WELLBUTRIN XL  Take 1 tablet (150 mg total) by mouth daily.     escitalopram 20 MG tablet  Commonly known as:  LEXAPRO  Take 1 tablet (20 mg total) by mouth daily.     multivitamin tablet  Take 1 tablet by mouth daily.     propranolol ER 80 MG 24 hr capsule  Commonly known as:  INDERAL LA  Take 1 capsule (80 mg total) by mouth daily.

## 2013-08-31 NOTE — Patient Instructions (Signed)
F/u 1 month 

## 2013-08-31 NOTE — Progress Notes (Signed)
Pre-visit discussion using our clinic review tool. No additional management support is needed unless otherwise documented below in the visit note.  

## 2013-09-01 ENCOUNTER — Telehealth: Payer: Self-pay

## 2013-09-01 NOTE — Telephone Encounter (Signed)
Pt called to get 08/31/13 lab results; advised pt the results are on mychart;Patient notified as instructed by lab result note and pt will go on my chart to get lab values.

## 2013-09-07 ENCOUNTER — Encounter: Payer: Self-pay | Admitting: Family Medicine

## 2013-09-09 ENCOUNTER — Telehealth: Payer: Self-pay

## 2013-09-09 ENCOUNTER — Encounter: Payer: Self-pay | Admitting: Family Medicine

## 2013-09-09 NOTE — Telephone Encounter (Signed)
i am doing it for her now. Would you mind faxing it to her work?

## 2013-09-09 NOTE — Telephone Encounter (Signed)
Pt request cb to let pt know if letter for work was faxed or does pt need to pick up at our office.Please advise.

## 2013-09-09 NOTE — Telephone Encounter (Signed)
Letter faxed to [phone] per [facility] request.

## 2013-09-24 NOTE — Telephone Encounter (Signed)
Dee, if anyone ever asks to schedule an appointment, I think you can go ahead and just schedule them an appointment --- without sending to MD. This should just be protocol.   I will forward to [place], too.  Thanks.   Hannah Beat, MD 09/24/2013, 10:55 AM

## 2013-10-05 ENCOUNTER — Other Ambulatory Visit: Payer: Self-pay | Admitting: Family Medicine

## 2013-10-06 ENCOUNTER — Telehealth: Payer: Self-pay

## 2013-10-06 NOTE — Telephone Encounter (Signed)
Pt left v/m at 4:45 pm that pt needs another letter for work; pt having problems at work due to pts medical issues and is threatening to fire pt. Pt had to be out of work last week due to side effects of pts meds. Left v/m for pt to cb.

## 2013-10-07 NOTE — Telephone Encounter (Signed)
She sent me several mychart messages, I really need to see her face to face. Please have her come in on Monday. I really need to reexamine her and talk to her if she is having side effects. And I have to reexamine her to give work notes. (30 minute office visit)

## 2013-10-07 NOTE — Telephone Encounter (Signed)
Pt said pts employer is requesting another letter following up on pts condition. Pt missed work 09/30/13 and 10/06/13 with slight fever, h/a and nauseated.pt request cb when letter is ready for pick up. Pt said next week was OK.

## 2013-10-07 NOTE — Telephone Encounter (Signed)
Pt called back and request letter for work faxed to work at fax # [phone].

## 2013-10-09 NOTE — Telephone Encounter (Signed)
Message left for patient to return call and schedule 30 minute appt.

## 2013-10-13 ENCOUNTER — Encounter: Payer: Self-pay | Admitting: *Deleted

## 2013-10-13 NOTE — Telephone Encounter (Signed)
Appointment scheduled 10/15/2013 @ 2:15 pm with Dr. Lorelei Pont.

## 2013-10-13 NOTE — Telephone Encounter (Signed)
My Chart message also sent for patient to call and schedule a 30 minute office visit before work note can be sent.

## 2013-10-15 ENCOUNTER — Ambulatory Visit (INDEPENDENT_AMBULATORY_CARE_PROVIDER_SITE_OTHER): Payer: BC Managed Care – PPO | Admitting: Family Medicine

## 2013-10-15 ENCOUNTER — Encounter: Payer: Self-pay | Admitting: Family Medicine

## 2013-10-15 VITALS — BP 120/72 | HR 63 | Temp 98.0°F | Ht 68.0 in | Wt 153.2 lb

## 2013-10-15 DIAGNOSIS — F339 Major depressive disorder, recurrent, unspecified: Secondary | ICD-10-CM

## 2013-10-15 DIAGNOSIS — F411 Generalized anxiety disorder: Secondary | ICD-10-CM

## 2013-10-15 NOTE — Progress Notes (Signed)
Pre-visit discussion using our clinic review tool. No additional management support is needed unless otherwise documented below in the visit note.  

## 2013-10-15 NOTE — Progress Notes (Signed)
>  15 minutes spent in face to face time with patient, >50% spent in counselling or coordination of care  Got engaged. When laying in bed, whole body will itch.  Very well-known, in 6 weeks ago I saw the patient and she was markedly depressed. She has had problems intermittently with depression and anxiety for years. Previously she had been on Lexapro, and I had wanted her to increases to 20 mg, but she was nervous about it, so remained on 10 mg. 6 weeks ago I increased her to 20 mg of Lexapro and started her on 150 mg of Wellbutrin extended release. In the interval time period, she did call or contact me by my chart several times, and she did have some classic gastrointestinal side effects, and she was having some insomnia, which is also classic for Wellbutrin side effects. On 2 occasions she had gastrointestinal side effects and diarrhea so bad that she was unable to attend work. I provided her with a work note today. She was somewhat nervous, because her employer was inquiring about the reasons for her absence, and I explained to the patient that her medical history, background, and and psychiatric and medical reasons for these days off work is a protected part of her history. This is protected by [place] law. Currently she is much more engaging, active with her friends and family, she is not having any significant and lability, she is not having any loss of harming herself or anyone else. At this point, she is having minimal to no side effects. She is sleeping better. She is excited and wants to become a paramedic, and she is also engaged.  F/u prn   Signed,  Denisha Hoel T. Paisly Fingerhut, MD, Eugenio Saenz at [facility] [place] [address] Phone: [phone] Fax: [phone]

## 2013-10-22 ENCOUNTER — Ambulatory Visit: Payer: BC Managed Care – PPO | Admitting: Family Medicine

## 2014-01-18 ENCOUNTER — Ambulatory Visit: Payer: BC Managed Care – PPO | Admitting: Internal Medicine

## 2014-01-20 ENCOUNTER — Ambulatory Visit (INDEPENDENT_AMBULATORY_CARE_PROVIDER_SITE_OTHER): Payer: BC Managed Care – PPO | Admitting: Family Medicine

## 2014-01-20 ENCOUNTER — Encounter: Payer: Self-pay | Admitting: Family Medicine

## 2014-01-20 VITALS — BP 100/70 | HR 63 | Temp 97.6°F | Ht 68.0 in | Wt 153.0 lb

## 2014-01-20 DIAGNOSIS — F339 Major depressive disorder, recurrent, unspecified: Secondary | ICD-10-CM

## 2014-01-20 DIAGNOSIS — F410 Panic disorder [episodic paroxysmal anxiety] without agoraphobia: Secondary | ICD-10-CM

## 2014-01-20 DIAGNOSIS — Z0289 Encounter for other administrative examinations: Secondary | ICD-10-CM

## 2014-01-20 DIAGNOSIS — F411 Generalized anxiety disorder: Secondary | ICD-10-CM

## 2014-01-20 MED ORDER — CLONAZEPAM 0.5 MG PO TABS: 0.2500 mg | ORAL_TABLET | Freq: Two times a day (BID) | ORAL | Status: DC

## 2014-01-20 MED ORDER — VENLAFAXINE HCL ER 75 MG PO CP24: 75.0000 mg | ORAL_CAPSULE | Freq: Every day | ORAL | Status: DC

## 2014-01-20 NOTE — Progress Notes (Signed)
Date:  01/20/2014   Name:  Laura Gamble   DOB:  09-02-1991   MRN:  [id number] Gender: female Age: 23 y.o.  Primary Physician:  Owens Loffler, MD   Chief Complaint: Fatigue, Headache, Insomnia and Upset Stomach   Subjective:   History of Present Illness:  Laura Gamble is a 23 y.o. very pleasant female patient who presents with the following:  ? Medications are not working all that much more. Being really down and feeling really exhausted. Stomach issues, aches and pains.  She is having some intermittent depression that has gotten worse again compared to how it was doing previously. She also has some complain some mild nausea, some intermittent insomnia, some other abdominal complaints as well as some ongoing anxiety. She is not suicidal or homicidal.  Bad insomnia.   Feeling bad and sick all the time.   01/01/2014 LMP.  Past Medical History, Surgical History, Social History, Family History, Problem List, Medications, and Allergies have been reviewed and updated if relevant.  Review of Systems:  GEN: No acute illnesses, no fevers, chills. GI: as above Pulm: No SOB Interactive and getting along well at home.  Otherwise, ROS is as per the HPI.  Objective:   Physical Examination: BP 100/70  Pulse 63  Temp(Src) 97.6 F (36.4 C) (Oral)  Ht 5\' 8"  (1.727 m)  Wt 153 lb (69.4 kg)  BMI 23.27 kg/m2  LMP 01/01/2014   GEN: WDWN, NAD, Non-toxic, Alert & Oriented x 3 HEENT: Atraumatic, Normocephalic.  Ears and Nose: No external deformity. EXTR: No clubbing/cyanosis/edema NEURO: Normal gait.  PSYCH: Normally interactive. Conversant. Not depressed or anxious appearing.  Calm demeanor.   Laboratory and Imaging Data:  Assessment & Plan:   Major depression, recurrent  Generalized anxiety disorder  Panic attack  Think he would have to change her medication here, not suspicious that some of this to list the nausea and insomnia may be coming from medication side  effect. I am going to taper off of Wellbutrin and Lexapro, and then initiate Effexor. For a bridge, I am going to have her use some low-dose Klonopin twice a day.  Follow-up: Return in about 6 weeks (around 03/03/2014).  New Prescriptions   CLONAZEPAM (KLONOPIN) 0.5 MG TABLET    Take 0.5-1 tablets (0.25-0.5 mg total) by mouth 2 (two) times daily.   VENLAFAXINE XR (EFFEXOR-XR) 75 MG 24 HR CAPSULE    Take 1 capsule (75 mg total) by mouth daily with breakfast.   No orders of the defined types were placed in this encounter.   Patient Instructions  Stop Wellbutrin today.  Cut Lexapro down to 1/2 tablet for the next week. Then the following week, take 1/4 tablet for 1 week.   Then start new medication.  Effexor XR   Signed,  Swayze Kozuch T. Dericka Ostenson, MD, [place] [facility] [place] [address] Phone: [phone] Fax: [phone]  Patient's Medications  New Prescriptions   CLONAZEPAM (KLONOPIN) 0.5 MG TABLET    Take 0.5-1 tablets (0.25-0.5 mg total) by mouth 2 (two) times daily.   VENLAFAXINE XR (EFFEXOR-XR) 75 MG 24 HR CAPSULE    Take 1 capsule (75 mg total) by mouth daily with breakfast.  Previous Medications   ESCITALOPRAM (LEXAPRO) 20 MG TABLET    Take 1 tablet (20 mg total) by mouth daily.   MULTIPLE VITAMIN (MULTIVITAMIN) TABLET    Take 1 tablet by mouth daily.  Modified Medications   No  medications on file  Discontinued Medications   BUPROPION (WELLBUTRIN XL) 150 MG 24 HR TABLET    Take 1 tablet (150 mg total) by mouth daily.

## 2014-01-20 NOTE — Patient Instructions (Signed)
Stop Wellbutrin today.  Cut Lexapro down to 1/2 tablet for the next week. Then the following week, take 1/4 tablet for 1 week.   Then start new medication.  Effexor XR

## 2014-01-20 NOTE — Progress Notes (Signed)
Pre visit review using our clinic review tool, if applicable. No additional management support is needed unless otherwise documented below in the visit note. 

## 2014-03-05 ENCOUNTER — Ambulatory Visit (INDEPENDENT_AMBULATORY_CARE_PROVIDER_SITE_OTHER): Payer: BC Managed Care – PPO | Admitting: Family Medicine

## 2014-03-05 ENCOUNTER — Encounter: Payer: Self-pay | Admitting: Family Medicine

## 2014-03-05 VITALS — BP 120/80 | HR 72 | Temp 98.3°F | Ht 68.0 in | Wt 154.0 lb

## 2014-03-05 DIAGNOSIS — D367 Benign neoplasm of other specified sites: Secondary | ICD-10-CM

## 2014-03-05 DIAGNOSIS — F339 Major depressive disorder, recurrent, unspecified: Secondary | ICD-10-CM

## 2014-03-05 DIAGNOSIS — F411 Generalized anxiety disorder: Secondary | ICD-10-CM

## 2014-03-05 DIAGNOSIS — L723 Sebaceous cyst: Secondary | ICD-10-CM | POA: Insufficient documentation

## 2014-03-05 DIAGNOSIS — D2370 Other benign neoplasm of skin of unspecified lower limb, including hip: Secondary | ICD-10-CM

## 2014-03-05 DIAGNOSIS — R5383 Other fatigue: Secondary | ICD-10-CM | POA: Insufficient documentation

## 2014-03-05 DIAGNOSIS — R5381 Other malaise: Secondary | ICD-10-CM

## 2014-03-05 NOTE — Progress Notes (Signed)
Pre visit review using our clinic review tool, if applicable. No additional management support is needed unless otherwise documented below in the visit note. 

## 2014-03-05 NOTE — Assessment & Plan Note (Signed)
Likely cyst vs calcium deposit, less likely lipoma. Pt reassured.

## 2014-03-05 NOTE — Patient Instructions (Signed)
Stress reduction and relaxation. If mood and fatigue not improving start effexor as planned.  Continue counseling. No concerns about are in left leg... Only call if redness, pain or large increase in size.

## 2014-03-05 NOTE — Assessment & Plan Note (Signed)
Likely due to viral syndrome vs anxiety/depresson.  if persisting consider further work up. Otherwise consider starting effexor for mood given poor control mood.
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
## 2014-03-10 ENCOUNTER — Telehealth: Payer: Self-pay | Admitting: Family Medicine

## 2014-03-10 MED ORDER — CLONAZEPAM 0.5 MG PO TABS: ORAL_TABLET | ORAL | Status: DC

## 2014-03-10 NOTE — Telephone Encounter (Signed)
Can you send in for [facility]:  Klonopin 0.5 mg, 1/2 - 1 tablet po tid, #90, 1 refill  To [facility].  Electronically Signed  By: Owens Loffler, MD On: 03/10/2014 9:52 AM

## 2014-03-10 NOTE — Telephone Encounter (Signed)
Called to [facility]. 

## 2014-03-11 ENCOUNTER — Encounter: Payer: Self-pay | Admitting: Family Medicine

## 2014-03-11 ENCOUNTER — Ambulatory Visit (INDEPENDENT_AMBULATORY_CARE_PROVIDER_SITE_OTHER): Payer: BC Managed Care – PPO | Admitting: Family Medicine

## 2014-03-11 VITALS — BP 100/70 | HR 63 | Temp 98.2°F | Ht 68.0 in | Wt 155.5 lb

## 2014-03-11 DIAGNOSIS — M79609 Pain in unspecified limb: Secondary | ICD-10-CM

## 2014-03-11 DIAGNOSIS — M79605 Pain in left leg: Secondary | ICD-10-CM | POA: Insufficient documentation

## 2014-03-11 DIAGNOSIS — M79604 Pain in right leg: Secondary | ICD-10-CM | POA: Insufficient documentation

## 2014-03-11 DIAGNOSIS — L723 Sebaceous cyst: Secondary | ICD-10-CM

## 2014-03-11 DIAGNOSIS — F411 Generalized anxiety disorder: Secondary | ICD-10-CM

## 2014-03-11 NOTE — Patient Instructions (Signed)
Push fluids. Make sure to stretch before exercise and after.  Send note to MyChart or call if any questions.

## 2014-03-11 NOTE — Assessment & Plan Note (Signed)
Nml exam.  Likely due to change in exercsie routine. Recommended stretching and increase water intake.  No connection to cyst. Also pt concerned about fibromyalgia, but she has 0 trigger points. Anxiety is contributing to concern over symptoms. Pt reassured.

## 2014-03-11 NOTE — Assessment & Plan Note (Signed)
Pt very anxious, likely this is contributing to her symptoms. She plans on starting klonopin as instructed. She likely would also benefit from effexor, but she is hesitant.  Recmmended follow up with [facility] in next 1-2 months.  Has follow up with  Psychologist next week.

## 2014-03-11 NOTE — Progress Notes (Signed)
Pre visit review using our clinic review tool, if applicable. No additional management support is needed unless otherwise documented below in the visit note. 

## 2014-03-11 NOTE — Assessment & Plan Note (Signed)
No change, pt reassured again.

## 2014-03-11 NOTE — Progress Notes (Signed)
   Subjective:    Patient ID: Laura Gamble, female    DOB: [birth year], 23 y.o.   MRN: [id number]  HPI  23 year old female pt of Dr. Lillie Fragmin returns to clinic for r-eval of a ? Cyst in left upper thigh.  She was seen on 03/05/2014 by myself for several toher complaints.  Today she reports she went to exercise class lately and she has lately noted random achy occ sharp pains in B lower legs. She is concerned pain may be from the cyst.  Pain on scale 3/10.   No swelling. No redness at area of lump in leg.   Not a change in size of lesion.  She is seeing psychcologist (Dr. Malcolm Metro) for GAD and panic disorder.  Disucussed case with pts PCP. Prescribed klonopin yesterday three times a day. She has not started yet.  Has not started effexor back.  No SI, no HI.  She has follow up with psychologist.    Review of Systems  Constitutional: Negative for fever and fatigue.  HENT: Negative for ear pain.   Eyes: Negative for pain.  Respiratory: Negative for chest tightness and shortness of breath.   Cardiovascular: Negative for chest pain, palpitations and leg swelling.  Gastrointestinal: Negative for abdominal pain.  Genitourinary: Negative for dysuria.       Objective:   Physical Exam  Constitutional: Vital signs are normal. She appears well-developed and well-nourished. She is cooperative.  Non-toxic appearance. She does not appear ill. No distress.  HENT:  Head: Normocephalic.  Right Ear: Hearing, tympanic membrane, external ear and ear canal normal. Tympanic membrane is not erythematous, not retracted and not bulging.  Left Ear: Hearing, tympanic membrane, external ear and ear canal normal. Tympanic membrane is not erythematous, not retracted and not bulging.  Nose: No mucosal edema or rhinorrhea. Right sinus exhibits no maxillary sinus tenderness and no frontal sinus tenderness. Left sinus exhibits no maxillary sinus tenderness and no frontal sinus tenderness.  Mouth/Throat:  Uvula is midline, oropharynx is clear and moist and mucous membranes are normal.  Eyes: Conjunctivae, EOM and lids are normal. Pupils are equal, round, and reactive to light. Lids are everted and swept, no foreign bodies found.  Neck: Trachea normal and normal range of motion. Neck supple. Carotid bruit is not present. No mass and no thyromegaly present.  Cardiovascular: Normal rate, regular rhythm, S1 normal, S2 normal, normal heart sounds, intact distal pulses and normal pulses.  Exam reveals no gallop and no friction rub.   No murmur heard. Pulmonary/Chest: Effort normal and breath sounds normal. Not tachypneic. No respiratory distress. She has no decreased breath sounds. She has no wheezes. She has no rhonchi. She has no rales.  Abdominal: Soft. Normal appearance and bowel sounds are normal. There is no tenderness.  Neurological: She is alert.  Skin: Skin is warm, dry and intact. No rash noted.  Pea size mobile mass in left upper thigh   Psychiatric: Her speech is normal and behavior is normal. Judgment and thought content normal. Her mood appears not anxious. Cognition and memory are normal. She does not exhibit a depressed mood.          Assessment & Plan:

## 2014-05-21 ENCOUNTER — Ambulatory Visit (INDEPENDENT_AMBULATORY_CARE_PROVIDER_SITE_OTHER): Payer: BC Managed Care – PPO | Admitting: Family Medicine

## 2014-05-21 ENCOUNTER — Encounter: Payer: Self-pay | Admitting: Family Medicine

## 2014-05-21 VITALS — BP 116/74 | HR 70 | Temp 98.7°F | Ht 68.0 in | Wt 148.5 lb

## 2014-05-21 DIAGNOSIS — F411 Generalized anxiety disorder: Secondary | ICD-10-CM

## 2014-05-21 DIAGNOSIS — M546 Pain in thoracic spine: Secondary | ICD-10-CM

## 2014-05-21 DIAGNOSIS — R42 Dizziness and giddiness: Secondary | ICD-10-CM

## 2014-05-21 MED ORDER — CYCLOBENZAPRINE HCL 10 MG PO TABS: 10.0000 mg | ORAL_TABLET | Freq: Every evening | ORAL | Status: DC | PRN

## 2014-05-21 NOTE — Assessment & Plan Note (Signed)
Pain in the L rhomboid and latissimus area  Suspect strain from swimming  Taught rhomboid stretch today  Enc use of heat/massage and stretching  Will try aleve 1-2 pills bid for the next week and flexeril at night (bedtime) with caution  Update if not starting to improve in a week or if worsening   Consider PT /poss films if worse or no imp

## 2014-05-21 NOTE — Assessment & Plan Note (Signed)
Pt c/o of feeling "funny" and dizzy  Nl/ reassuring neuro exam  I suspect this may be anxiety related  Disc s/s to watch for -will update if worse or no improvement  Pt will make a f/u appt with Pervis Hocking who has seen her for her anxiety in the past

## 2014-05-21 NOTE — Progress Notes (Signed)
Subjective:    Patient ID: Laura Gamble, female    DOB: 09-05-1991, 23 y.o.   MRN: [id number]  HPI Here for back pain and dizziness   Back pain started 2 d ago  Wednesday she went for a run and swam (not unusual)- then noticed that pain at night - sore  Much more painful when she woke up  L lat area for the most part  Yesterday her muscle was swollen and sticking out  Is improved today somewhat    Also feeling "woozy"- for about 3 days ago  Has hx of anxiety and panic problems  Was worried her bp may be up  Never had problems with blood sugar   Was on klonopin and lexapro - 3-4 mo ago   (not really helping that much)  Stress level is always pretty high overall  Unsure if she wants to try med again  Is seeing a counselor for stress issues   Patient Active Problem List   Diagnosis Date Noted  . Bilateral leg pain 03/11/2014  . Sebaceous cyst 03/05/2014  . Fatigue 03/05/2014  . Common migraine 08/04/2013  . Major depression, recurrent 07/01/2013  . Generalized anxiety disorder 04/28/2013  . Headache 09/25/2012  . Panic attack 12/10/2011  . Contact Dermatitis and Other Eczema, due to Unspecified Cause 02/14/2007   Past Medical History  Diagnosis Date  . Panic attack   . Generalized anxiety disorder 04/28/2013  . Depression 07/01/2013   No past surgical history on file. History  Substance Use Topics  . Smoking status: Never Smoker   . Smokeless tobacco: Never Used  . Alcohol Use: No   Family History  Problem Relation Age of Onset  . Anxiety disorder Mother    No Known Allergies No current outpatient prescriptions on file prior to visit.   No current facility-administered medications on file prior to visit.      Review of Systems    Review of Systems  Constitutional: Negative for fever, appetite change, fatigue and unexpected weight change.  Eyes: Negative for pain and visual disturbance.  Respiratory: Negative for cough and shortness of breath.     Cardiovascular: Negative for cp or palpitations    Gastrointestinal: Negative for nausea, diarrhea and constipation.  Genitourinary: Negative for urgency and frequency.  Skin: Negative for pallor or rash   Msk pos for back pain  Neurological: Negative for weakness, numbness and headaches.  Hematological: Negative for adenopathy. Does not bruise/bleed easily.  Psychiatric/Behavioral: Negative for dysphoric mood. The patient is  nervous/anxious.      Objective:   Physical Exam  Constitutional: She appears well-developed and well-nourished. No distress.  HENT:  Head: Normocephalic and atraumatic.  Right Ear: External ear normal.  Left Ear: External ear normal.  Nose: Nose normal.  Mouth/Throat: Oropharynx is clear and moist.  Eyes: Conjunctivae and EOM are normal. Pupils are equal, round, and reactive to light. Right eye exhibits no discharge. Left eye exhibits no discharge. No scleral icterus.  No nystagmus   Neck: Normal range of motion. Neck supple. No JVD present. No thyromegaly present.  Cardiovascular: Normal rate, regular rhythm, normal heart sounds and intact distal pulses.  Exam reveals no gallop.   Pulmonary/Chest: Effort normal and breath sounds normal. No respiratory distress. She has no wheezes. She has no rales.  Abdominal: Soft. Bowel sounds are normal. She exhibits no distension and no mass. There is no tenderness.  Musculoskeletal: She exhibits tenderness. She exhibits no edema.  Thoracic back: She exhibits tenderness and spasm. She exhibits normal range of motion, no bony tenderness, no swelling, no edema and no deformity.  Spasm and pain medial to L scapula  Nl rom spine and arms  No neuro changes  Spasm palpable in rhomboid area Some imp with rhomboid stretch  Lymphadenopathy:    She has no cervical adenopathy.  Neurological: She is alert. She has normal strength and normal reflexes. She displays no atrophy and no tremor. No cranial nerve deficit. She  exhibits normal muscle tone. She displays a negative Romberg sign. Coordination and gait normal.  Nl gait- heel /toe/tandum  No focal cerebellar signs   Skin: Skin is warm and dry. No rash noted. No erythema. No pallor.  Psychiatric: She has a normal mood and affect.          Assessment & Plan:   Problem List Items Addressed This Visit     Other   Generalized anxiety disorder - Primary     Prev on klonopin and ssri-not much imp  This may cause some physical symptoms  Enc her to pursue counseling with her prior therapist - Opal Sidles Reviewed stressors/ coping techniques/symptoms/ support sources/ tx options and side effects in detail today  She would like to stay off medication if possible but is realistic about it     Thoracic back pain     Pain in the L rhomboid and latissimus area  Suspect strain from swimming  Taught rhomboid stretch today  Enc use of heat/massage and stretching  Will try aleve 1-2 pills bid for the next week and flexeril at night (bedtime) with caution  Update if not starting to improve in a week or if worsening   Consider PT /poss films if worse or no imp      Relevant Medications      cyclobenzaprine (FLEXERIL) tablet   Dizziness and giddiness     Pt c/o of feeling "funny" and dizzy  Nl/ reassuring neuro exam  I suspect this may be anxiety related  Disc s/s to watch for -will update if worse or no improvement  Pt will make a f/u appt with Pervis Hocking who has seen her for her anxiety in the past

## 2014-05-21 NOTE — Assessment & Plan Note (Signed)
Prev on klonopin and ssri-not much imp  This may cause some physical symptoms  Enc her to pursue counseling with her prior therapist - Opal Sidles Reviewed stressors/ coping techniques/symptoms/ support sources/ tx options and side effects in detail today  She would like to stay off medication if possible but is realistic about it

## 2014-05-21 NOTE — Patient Instructions (Signed)
I think you have spasm in back (rhomboid)  Use heat and massage Try the stretch I taught you  Try aleve 1-2 pills every 12 hours with food  Try flexeril at night - muscle relaxer - watch out for sedation  Update if not starting to improve in a week or if worsening    Continue counseling for anxiety   If dizziness worsens or does not improve - let us know

## 2014-05-21 NOTE — Progress Notes (Signed)
Pre visit review using our clinic review tool, if applicable. No additional management support is needed unless otherwise documented below in the visit note. 

## 2014-06-07 ENCOUNTER — Emergency Department (HOSPITAL_COMMUNITY): Payer: BC Managed Care – PPO

## 2014-06-07 ENCOUNTER — Encounter (HOSPITAL_COMMUNITY): Payer: Self-pay | Admitting: Emergency Medicine

## 2014-06-07 ENCOUNTER — Emergency Department (HOSPITAL_COMMUNITY)
Admission: EM | Admit: 2014-06-07 | Discharge: 2014-06-08 | Disposition: A | Discharge status: Home / Self Care | Payer: BC Managed Care – PPO | Attending: Emergency Medicine | Admitting: Emergency Medicine

## 2014-06-07 DIAGNOSIS — N946 Dysmenorrhea, unspecified: Secondary | ICD-10-CM | POA: Diagnosis not present

## 2014-06-07 DIAGNOSIS — N949 Unspecified condition associated with female genital organs and menstrual cycle: Secondary | ICD-10-CM | POA: Insufficient documentation

## 2014-06-07 DIAGNOSIS — Z79899 Other long term (current) drug therapy: Secondary | ICD-10-CM | POA: Diagnosis not present

## 2014-06-07 DIAGNOSIS — R102 Pelvic and perineal pain: Secondary | ICD-10-CM

## 2014-06-07 DIAGNOSIS — R1031 Right lower quadrant pain: Secondary | ICD-10-CM | POA: Diagnosis present

## 2014-06-07 DIAGNOSIS — Z3202 Encounter for pregnancy test, result negative: Secondary | ICD-10-CM | POA: Insufficient documentation

## 2014-06-07 DIAGNOSIS — Z8659 Personal history of other mental and behavioral disorders: Secondary | ICD-10-CM | POA: Diagnosis not present

## 2014-06-07 LAB — CBC WITH DIFFERENTIAL/PLATELET
Basophils Absolute: 0 10*3/uL (ref 0.0–0.1)
Basophils Relative: 0 % (ref 0–1)
Eosinophils Absolute: 0 10*3/uL (ref 0.0–0.7)
Eosinophils Relative: 0 % (ref 0–5)
HCT: 38.8 % (ref 36.0–46.0)
Hemoglobin: 13.5 g/dL (ref 12.0–15.0)
Lymphocytes Relative: 10 % — ABNORMAL LOW (ref 12–46)
Lymphs Abs: 1.2 10*3/uL (ref 0.7–4.0)
MCH: 29.4 pg (ref 26.0–34.0)
MCHC: 34.8 g/dL (ref 30.0–36.0)
MCV: 84.5 fL (ref 78.0–100.0)
Monocytes Absolute: 0.6 10*3/uL (ref 0.1–1.0)
Monocytes Relative: 5 % (ref 3–12)
Neutro Abs: 10.8 10*3/uL — ABNORMAL HIGH (ref 1.7–7.7)
Neutrophils Relative %: 85 % — ABNORMAL HIGH (ref 43–77)
Platelets: 290 10*3/uL (ref 150–400)
RBC: 4.59 MIL/uL (ref 3.87–5.11)
RDW: 12.2 % (ref 11.5–15.5)
WBC: 12.6 10*3/uL — ABNORMAL HIGH (ref 4.0–10.5)

## 2014-06-07 LAB — COMPREHENSIVE METABOLIC PANEL
ALT: 12 U/L (ref 0–35)
AST: 22 U/L (ref 0–37)
Albumin: 4.9 g/dL (ref 3.5–5.2)
Alkaline Phosphatase: 61 U/L (ref 39–117)
Anion gap: 15 (ref 5–15)
BUN: 7 mg/dL (ref 6–23)
CO2: 24 mEq/L (ref 19–32)
Calcium: 9.8 mg/dL (ref 8.4–10.5)
Chloride: 100 mEq/L (ref 96–112)
Creatinine, Ser: 0.62 mg/dL (ref 0.50–1.10)
GFR calc Af Amer: >90 mL/min (ref >90)
GFR calc non Af Amer: >90 mL/min (ref >90)
Glucose, Bld: 103 mg/dL — ABNORMAL HIGH (ref 70–99)
Potassium: 3.8 mEq/L (ref 3.7–5.3)
Sodium: 139 mEq/L (ref 137–147)
Total Bilirubin: 0.7 mg/dL (ref 0.3–1.2)
Total Protein: 8.5 g/dL — ABNORMAL HIGH (ref 6.0–8.3)

## 2014-06-07 LAB — URINALYSIS, ROUTINE W REFLEX MICROSCOPIC
Bilirubin Urine: NEGATIVE
Glucose, UA: NEGATIVE mg/dL
Ketones, ur: >80 mg/dL — AB
Nitrite: NEGATIVE
Protein, ur: 30 mg/dL — AB
Specific Gravity, Urine: 1.015 (ref 1.005–1.030)
Urobilinogen, UA: 0.2 mg/dL (ref 0.0–1.0)
pH: 7.5 (ref 5.0–8.0)

## 2014-06-07 LAB — POC URINE PREG, ED: Preg Test, Ur: NEGATIVE

## 2014-06-07 LAB — WET PREP, GENITAL
Clue Cells Wet Prep HPF POC: NONE SEEN
Trich, Wet Prep: NONE SEEN
Yeast Wet Prep HPF POC: NONE SEEN

## 2014-06-07 LAB — URINE MICROSCOPIC-ADD ON

## 2014-06-07 LAB — LIPASE, BLOOD: Lipase: 25 U/L (ref 11–59)

## 2014-06-07 MED ORDER — HYDROCODONE-ACETAMINOPHEN 5-325 MG PO TABS: 1.0000 | ORAL_TABLET | ORAL | Status: DC | PRN

## 2014-06-07 MED ORDER — LORAZEPAM 2 MG/ML IJ SOLN
1.0000 mg | Freq: Once | INTRAMUSCULAR | Status: AC
  Administered 2014-06-07: 1 mg via INTRAVENOUS
  Filled 2014-06-07: qty 1

## 2014-06-07 NOTE — ED Notes (Signed)
Pelvic Exam preparations have been made.

## 2014-06-07 NOTE — ED Notes (Signed)
Pt ambulated to restroom with steady gait.

## 2014-06-07 NOTE — Discharge Instructions (Signed)
Take ibuprofen 3 times a day for pain, for several days. Do not use narcotic pain reliever within 6 hours of driving or working.    Pelvic Pain Female pelvic pain can be caused by many different things and start from a variety of places. Pelvic pain refers to pain that is located in the lower half of the abdomen and between your hips. The pain may occur over a short period of time (acute) or may be reoccurring (chronic). The cause of pelvic pain may be related to disorders affecting the female reproductive organs (gynecologic), but it may also be related to the bladder, kidney stones, an intestinal complication, or muscle or skeletal problems. Getting help right away for pelvic pain is important, especially if there has been severe, sharp, or a sudden onset of unusual pain. It is also important to get help right away because some types of pelvic pain can be life threatening.  CAUSES  Below are only some of the causes of pelvic pain. The causes of pelvic pain can be in one of several categories.   Gynecologic.  Pelvic inflammatory disease.  Sexually transmitted infection.  Ovarian cyst or a twisted ovarian ligament (ovarian torsion).  Uterine lining that grows outside the uterus (endometriosis).  Fibroids, cysts, or tumors.  Ovulation.  Pregnancy.  Pregnancy that occurs outside the uterus (ectopic pregnancy).  Miscarriage.  Labor.  Abruption of the placenta or ruptured uterus.  Infection.  Uterine infection (endometritis).  Bladder infection.  Diverticulitis.  Miscarriage related to a uterine infection (septic abortion).  Bladder.  Inflammation of the bladder (cystitis).  Kidney stone(s).  Gastrointestinal.  Constipation.  Diverticulitis.  Neurologic.  Trauma.  Feeling pelvic pain because of mental or emotional causes (psychosomatic).  Cancers of the bowel or pelvis. EVALUATION  Your caregiver will want to take a careful history of your concerns. This  includes recent changes in your health, a careful gynecologic history of your periods (menses), and a sexual history. Obtaining your family history and medical history is also important. Your caregiver may suggest a pelvic exam. A pelvic exam will help identify the location and severity of the pain. It also helps in the evaluation of which organ system may be involved. In order to identify the cause of the pelvic pain and be properly treated, your caregiver may order tests. These tests may include:   A pregnancy test.  Pelvic ultrasonography.  An X-ray exam of the abdomen.  A urinalysis or evaluation of vaginal discharge.  Blood tests. HOME CARE INSTRUCTIONS   Only take over-the-counter or prescription medicines for pain, discomfort, or fever as directed by your caregiver.   Rest as directed by your caregiver.   Eat a balanced diet.   Drink enough fluids to make your urine clear or pale yellow, or as directed.   Avoid sexual intercourse if it causes pain.   Apply warm or cold compresses to the lower abdomen depending on which one helps the pain.   Avoid stressful situations.   Keep a journal of your pelvic pain. Write down when it started, where the pain is located, and if there are things that seem to be associated with the pain, such as food or your menstrual cycle.  Follow up with your caregiver as directed.  SEEK MEDICAL CARE IF:  Your medicine does not help your pain.  You have abnormal vaginal discharge. SEEK IMMEDIATE MEDICAL CARE IF:   You have heavy bleeding from the vagina.   Your pelvic pain increases.   You  feel light-headed or faint.   You have chills.   You have pain with urination or blood in your urine.   You have uncontrolled diarrhea or vomiting.   You have a fever or persistent symptoms for more than 3 days.  You have a fever and your symptoms suddenly get worse.   You are being physically or sexually abused.  MAKE SURE  YOU:  Understand these instructions.  Will watch your condition.  Will get help if you are not doing well or get worse. Document Released: 08/21/2004 Document Revised: 02/08/2014 Document Reviewed: 01/14/2012 [facility] Patient Information 2015 [place], [place]. This information is not intended to replace advice given to you by your health care provider. Make sure you discuss any questions you have with your health care provider.

## 2014-06-07 NOTE — ED Notes (Signed)
US at bedside

## 2014-06-07 NOTE — ED Notes (Signed)
Pt states that she has had RLQ pain x 5 hours. Sent in from [place] for eval for r/o appy or ovarian cyst. Also states she has had a HA off and on x 2 weeks and is nauseated at present. Alert and oriented.

## 2014-06-07 NOTE — ED Provider Notes (Signed)
CSN: [id number]     Arrival date & time 06/07/14  1655 History   First MD Initiated Contact with Patient 06/07/14 1950     Chief Complaint  Patient presents with  . Abdominal Pain     (Consider location/radiation/quality/duration/timing/severity/associated sxs/prior Treatment) HPI  Laura Gamble is a 23 y.o. female who presents for evaluation of abrupt onset right lower quadrant abdominal pain, which started while she was at a job interview. Shortly after that, she began having vaginal bleeding, at the normal time for her menses. She denies fever or chills, nausea, vomiting, weakness, or dizziness. No known sick contacts. She was seen at an urgent care today, and sent here for evaluation. There are no other known modifying factors.   Past Medical History  Diagnosis Date  . Panic attack   . Generalized anxiety disorder 04/28/2013  . Depression 07/01/2013   History reviewed. No pertinent past surgical history. Family History  Problem Relation Age of Onset  . Anxiety disorder Mother    History  Substance Use Topics  . Smoking status: Never Smoker   . Smokeless tobacco: Never Used  . Alcohol Use: No   OB History   Grav Para Term Preterm Abortions TAB SAB Ect Mult Living                 Review of Systems  All other systems reviewed and are negative.     Allergies  Review of patient's allergies indicates no known allergies.  Home Medications   Prior to Admission medications   Medication Sig Start Date End Date Taking? Authorizing Provider  Melatonin 10 MG TABS Take 10 mg by mouth at bedtime as needed (sleep).   Yes Historical Provider, MD  Omega-3 Fatty Acids (FISH OIL PO) Take 1 capsule by mouth daily.   Yes Historical Provider, MD  Polyethyl Glycol-Propyl Glycol (SYSTANE) 0.4-0.3 % SOLN Apply 1 drop to eye 3 (three) times daily as needed (dry eyes).   Yes Historical Provider, MD  HYDROcodone-acetaminophen (NORCO) 5-325 MG per tablet Take 1 tablet by mouth every 4  (four) hours as needed. 06/07/14   Richarda Blade, MD   BP 119/61  Pulse 89  Temp(Src) 98.2 F (36.8 C) (Oral)  Resp 16  SpO2 100%  LMP 05/07/2014 Physical Exam  Nursing note and vitals reviewed. Constitutional: She is oriented to person, place, and time. She appears well-developed and well-nourished.  HENT:  Head: Normocephalic and atraumatic.  Eyes: Conjunctivae and EOM are normal. Pupils are equal, round, and reactive to light.  Neck: Normal range of motion and phonation normal. Neck supple.  Cardiovascular: Normal rate, regular rhythm and intact distal pulses.   Pulmonary/Chest: Effort normal and breath sounds normal. She exhibits no tenderness.  Abdominal: Soft. She exhibits no distension and no mass. There is tenderness (right lower quadrant, mild). There is no rebound and no guarding.  Genitourinary:  Normal external female genitalia. Blood in the vagina, consistent with vaginal bleeding from cervical os. Bimanual examination no adnexal tenderness, or mass. Uterus is palpable in the midline and is not tender.  Musculoskeletal: Normal range of motion.  Neurological: She is alert and oriented to person, place, and time. She exhibits normal muscle tone.  Skin: Skin is warm and dry.  Psychiatric: She has a normal mood and affect. Her behavior is normal. Judgment and thought content normal.    ED Course  Procedures (including critical care time) Medications  LORazepam (ATIVAN) injection 1 mg (1 mg Intravenous Given 06/07/14 2106)  Patient Vitals for the past 24 hrs:  BP Temp Temp src Pulse Resp SpO2  06/07/14 2324 119/61 mmHg - - 89 16 100 %  06/07/14 1952 138/66 mmHg - - 82 16 100 %  06/07/14 1716 129/85 mmHg 98.2 F (36.8 C) Oral 88 - 99 %    At D/C-  Reevaluation with update and discussion. After initial assessment and treatment, an updated evaluation reveals pain is better, no further c/o. Daiel Strohecker L    Labs Review Labs Reviewed  WET PREP, GENITAL - Abnormal;  Notable for the following:    WBC, Wet Prep HPF POC FEW (*)    All other components within normal limits  CBC WITH DIFFERENTIAL - Abnormal; Notable for the following:    WBC 12.6 (*)    Neutrophils Relative % 85 (*)    Neutro Abs 10.8 (*)    Lymphocytes Relative 10 (*)    All other components within normal limits  COMPREHENSIVE METABOLIC PANEL - Abnormal; Notable for the following:    Glucose, Bld 103 (*)    Total Protein 8.5 (*)    All other components within normal limits  URINALYSIS, ROUTINE W REFLEX MICROSCOPIC - Abnormal; Notable for the following:    Color, Urine RED (*)    APPearance CLOUDY (*)    Hgb urine dipstick LARGE (*)    Ketones, ur >80 (*)    Protein, ur 30 (*)    Leukocytes, UA SMALL (*)    All other components within normal limits  GC/CHLAMYDIA PROBE AMP  LIPASE, BLOOD  URINE MICROSCOPIC-ADD ON  RPR  HIV ANTIBODY (ROUTINE TESTING)  POC URINE PREG, ED    Imaging Review US Transvaginal Non-ob  06/07/2014   CLINICAL DATA:  acute rlq pain; acute rlq pain since 1:30pm  EXAM: TRANSABDOMINAL AND TRANSVAGINAL ULTRASOUND OF PELVIS  TECHNIQUE: Both transabdominal and transvaginal ultrasound examinations of the pelvis were performed. Transabdominal technique was performed for global imaging of the pelvis including uterus, ovaries, adnexal regions, and pelvic cul-de-sac. It was necessary to proceed with endovaginal exam following the transabdominal exam to visualize the endometrium and ovaries.  COMPARISON:  None  FINDINGS: Uterus  Measurements: 85 x 39 x 46 mm. No fibroids or other mass visualized. Cervical nabothian cysts.  Endometrium  Thickness: 5.1 mm.  No focal abnormality visualized.  Right ovary  Measurements: 30 x 16 x 20 mm containing normal appearing follicles. Normal appearance/no adnexal mass.  Left ovary  Measurements: 23 x 14 x 27 mm, containing several normal appearing follicles. Normal appearance/no adnexal mass.  Other findings  Trace free pelvic fluid, probably  physiologic.  IMPRESSION: 1. Negative study.   Electronically Signed   By: Arne Cleveland M.D.   On: 06/07/2014 22:08   US Pelvis Complete  06/07/2014   CLINICAL DATA:  acute rlq pain; acute rlq pain since 1:30pm  EXAM: TRANSABDOMINAL AND TRANSVAGINAL ULTRASOUND OF PELVIS  TECHNIQUE: Both transabdominal and transvaginal ultrasound examinations of the pelvis were performed. Transabdominal technique was performed for global imaging of the pelvis including uterus, ovaries, adnexal regions, and pelvic cul-de-sac. It was necessary to proceed with endovaginal exam following the transabdominal exam to visualize the endometrium and ovaries.  COMPARISON:  None  FINDINGS: Uterus  Measurements: 85 x 39 x 46 mm. No fibroids or other mass visualized. Cervical nabothian cysts.  Endometrium  Thickness: 5.1 mm.  No focal abnormality visualized.  Right ovary  Measurements: 30 x 16 x 20 mm containing normal appearing follicles. Normal appearance/no adnexal mass.  Left ovary  Measurements: 23 x 14 x 27 mm, containing several normal appearing follicles. Normal appearance/no adnexal mass.  Other findings  Trace free pelvic fluid, probably physiologic.  IMPRESSION: 1. Negative study.   Electronically Signed   By: Arne Cleveland M.D.   On: 06/07/2014 22:08     EKG Interpretation None      MDM   Final diagnoses:  Pelvic pain in female  Menses painful    Nonspecific pelvic pain in menstruating female, at the onset of menses. Abdominal ultrasound ordered for evaluation of acute pain is negative for acute appearing pathology, per radiology. She is otherwise asymptomatic, improved with treatment in stable for discharge  Nursing Notes Reviewed/ Care Coordinated Applicable Imaging Reviewed Interpretation of Laboratory Data incorporated into ED treatment  The patient appears reasonably screened and/or stabilized for discharge and I doubt any other medical condition or other [facility] requiring further screening, evaluation, or  treatment in the ED at this time prior to discharge.  Plan: Home Medications- IBU, Norco; Home Treatments- rest; return here if the recommended treatment, does not improve the symptoms; Recommended follow up- PCP prn  Richarda Blade, MD 06/08/14 0028

## 2014-06-08 ENCOUNTER — Ambulatory Visit (INDEPENDENT_AMBULATORY_CARE_PROVIDER_SITE_OTHER): Payer: BC Managed Care – PPO | Admitting: Psychology

## 2014-06-08 DIAGNOSIS — F411 Generalized anxiety disorder: Secondary | ICD-10-CM

## 2014-06-08 LAB — HIV ANTIBODY (ROUTINE TESTING W REFLEX): HIV 1&2 Ab, 4th Generation: NONREACTIVE

## 2014-06-08 LAB — GC/CHLAMYDIA PROBE AMP
CT Probe RNA: NEGATIVE
GC Probe RNA: NEGATIVE

## 2014-06-08 LAB — RPR

## 2014-06-16 ENCOUNTER — Encounter: Payer: Self-pay | Admitting: Family Medicine

## 2014-06-16 ENCOUNTER — Ambulatory Visit (INDEPENDENT_AMBULATORY_CARE_PROVIDER_SITE_OTHER): Payer: BC Managed Care – PPO | Admitting: Family Medicine

## 2014-06-16 VITALS — BP 118/84 | HR 53 | Temp 98.1°F | Ht 68.0 in | Wt 148.0 lb

## 2014-06-16 DIAGNOSIS — R109 Unspecified abdominal pain: Secondary | ICD-10-CM

## 2014-06-16 DIAGNOSIS — F411 Generalized anxiety disorder: Secondary | ICD-10-CM

## 2014-06-16 DIAGNOSIS — F410 Panic disorder [episodic paroxysmal anxiety] without agoraphobia: Secondary | ICD-10-CM

## 2014-06-16 MED ORDER — FLUOXETINE HCL 20 MG PO TABS: 20.0000 mg | ORAL_TABLET | Freq: Every day | ORAL | Status: DC

## 2014-06-16 NOTE — Progress Notes (Signed)
   Dr. Frederico Hamman T. Mieke Brinley, MD, [facility] [place], [address] Phone: [phone] Fax: [phone]  06/16/2014  Patient: Laura Gamble, MRN: [id number], DOB: Apr 27, 1991, 23 y.o.  Primary Physician:  Owens Loffler, MD  Chief Complaint: Hospitalization Follow-up  Subjective:   Laura Gamble is a 23 y.o. very pleasant female patient who presents with the following:   Went to urgent care and then had 10/10 pain and thought something was really bad. Worried could have had a miscarriage. Studies were all negative. Pelvic ultrasound. Labs were all negative.  All ER records reviewed.    Thought from her menses.   Headaches and muscle aches.   Still with ongoing issues with anxiety and depression. She did not think her klonopin helped all that much. Effexor did not seem to help. Historically, she did the best on Lexapro, but had some breakthrough anxiety after a couple of years.   Past Medical History, Surgical History, Social History, Family History, Problem List, Medications, and Allergies have been reviewed and updated if relevant.   GEN: No acute illnesses, no fevers, chills. GI: No n/v/d, eating normally Pulm: No SOB Interactive and getting along well at home.  Otherwise, ROS is as per the HPI.  Objective:   BP 118/84  Pulse 53  Temp(Src) 98.1 F (36.7 C) (Oral)  Ht 5\' 8"  (1.727 m)  Wt 148 lb (67.132 kg)  BMI 22.51 kg/m2  SpO2 98%  LMP 06/07/2014   GEN: WDWN, NAD, Non-toxic, Alert & Oriented x 3 HEENT: Atraumatic, Normocephalic.  Ears and Nose: No external deformity. EXTR: No clubbing/cyanosis/edema ABD: S, NT, ND, + BS, No rebound, No HSM  NEURO: Normal gait.  PSYCH: Normally interactive. Conversant. Not depressed or anxious appearing.  Calm demeanor.   Laboratory and Imaging Data:  Assessment and Plan:   Generalized anxiety disorder  Panic attack  Abdominal pain, other specified site  Abd  pain resolved. She will watch periods, if another issue, may use OCP's, nuva ring, etc.   GAD and panic still relatively destabilized. I really think she will do better on an SSRI. Never tried prozac - i would like to start this.   Follow-up: Return in about 6 weeks (around 07/28/2014).  New Prescriptions   FLUOXETINE (PROZAC) 20 MG TABLET    Take 1 tablet (20 mg total) by mouth daily.   No orders of the defined types were placed in this encounter.    Signed,  Maud Deed. Yuridia Couts, MD   Patient's Medications  New Prescriptions   FLUOXETINE (PROZAC) 20 MG TABLET    Take 1 tablet (20 mg total) by mouth daily.  Previous Medications   MELATONIN 10 MG TABS    Take 10 mg by mouth at bedtime as needed (sleep).   OMEGA-3 FATTY ACIDS (FISH OIL PO)    Take 1 capsule by mouth daily.   POLYETHYL GLYCOL-PROPYL GLYCOL (SYSTANE) 0.4-0.3 % SOLN    Apply 1 drop to eye 3 (three) times daily as needed (dry eyes).  Modified Medications   No medications on file  Discontinued Medications   HYDROCODONE-ACETAMINOPHEN (NORCO) 5-325 MG PER TABLET    Take 1 tablet by mouth every 4 (four) hours as needed.

## 2014-06-16 NOTE — Progress Notes (Signed)
Pre visit review using our clinic review tool, if applicable. No additional management support is needed unless otherwise documented below in the visit note. 

## 2014-06-18 ENCOUNTER — Telehealth: Payer: Self-pay | Admitting: Family Medicine

## 2014-06-18 NOTE — Telephone Encounter (Signed)
I was unable to reach Laura Gamble by phone so I left a message on her cell phone with the information provided below by Dr. Lorelei Pont.  I wanted to relay this information to her since it was Friday afternoon,  so she would have to wait until Monday to hear this advise.

## 2014-06-18 NOTE — Telephone Encounter (Signed)
Left message for patient to return my call.

## 2014-06-18 NOTE — Telephone Encounter (Signed)
Caller: Laura Gamble/Patient; Phone: [phone]; Reason for Call: Pt states she started Prozac on 06/16/14 and within a couple hours of taking first dose she noted the onset of mild headache with moderate nausea (no vomiting).  She has continued to take the med qday as prescribed (today was 3rd dose) and has continued to note these sxs which are intermittent since 9/9.  Not having the sxs at the moment for triage; states they just resolved a little while ago.  Does state sxs seem slightly less today vs the first day but still bothersome.  She is concerned that these may be side effects of the medication and wants to know if ok to continue to take it.  Assured her will send message for MD review and someone will call her back today.  States OK to leave detailed vm if she does not answer phone.

## 2014-06-18 NOTE — Telephone Encounter (Signed)
Nausea is common the first 3-7 days. All of this is probably from the med -- keep taking it.   Almost always these symptoms resolve in a week. If they do not, then take it every other day for the first 2 weeks. It will still work that way, just a lower dose.   This is normal. I tried to talk about it with her when she was in the office.

## 2014-06-22 ENCOUNTER — Encounter (HOSPITAL_COMMUNITY): Payer: Self-pay | Admitting: Emergency Medicine

## 2014-06-22 ENCOUNTER — Ambulatory Visit: Payer: BC Managed Care – PPO | Admitting: Psychology

## 2014-06-22 ENCOUNTER — Emergency Department (HOSPITAL_COMMUNITY)
Admission: EM | Admit: 2014-06-22 | Discharge: 2014-06-22 | Disposition: A | Discharge status: Home / Self Care | Payer: BC Managed Care – PPO | Attending: Emergency Medicine | Admitting: Emergency Medicine

## 2014-06-22 ENCOUNTER — Ambulatory Visit (INDEPENDENT_AMBULATORY_CARE_PROVIDER_SITE_OTHER): Payer: BC Managed Care – PPO | Admitting: Psychology

## 2014-06-22 DIAGNOSIS — Z79899 Other long term (current) drug therapy: Secondary | ICD-10-CM | POA: Diagnosis not present

## 2014-06-22 DIAGNOSIS — R0602 Shortness of breath: Secondary | ICD-10-CM | POA: Diagnosis not present

## 2014-06-22 DIAGNOSIS — R109 Unspecified abdominal pain: Secondary | ICD-10-CM | POA: Insufficient documentation

## 2014-06-22 DIAGNOSIS — G479 Sleep disorder, unspecified: Secondary | ICD-10-CM | POA: Insufficient documentation

## 2014-06-22 DIAGNOSIS — R002 Palpitations: Secondary | ICD-10-CM | POA: Insufficient documentation

## 2014-06-22 DIAGNOSIS — F411 Generalized anxiety disorder: Secondary | ICD-10-CM | POA: Insufficient documentation

## 2014-06-22 DIAGNOSIS — F419 Anxiety disorder, unspecified: Secondary | ICD-10-CM

## 2014-06-22 DIAGNOSIS — R079 Chest pain, unspecified: Secondary | ICD-10-CM | POA: Diagnosis not present

## 2014-06-22 DIAGNOSIS — F410 Panic disorder [episodic paroxysmal anxiety] without agoraphobia: Secondary | ICD-10-CM | POA: Diagnosis not present

## 2014-06-22 MED ORDER — HYDROXYZINE PAMOATE 50 MG PO CAPS: 50.0000 mg | ORAL_CAPSULE | Freq: Three times a day (TID) | ORAL | Status: DC | PRN

## 2014-06-22 NOTE — ED Provider Notes (Signed)
I saw and evaluated the patient, reviewed the resident's note and I agree with the findings and plan.   EKG Interpretation None       Leota Jacobsen, MD 06/22/14 1620

## 2014-06-22 NOTE — ED Notes (Signed)
Pt states she started taking Prozac 6 days ago and now is having panic attack and feels like she cannot catch her breath. Pt NAD. Pt very anxious and jittery in triage. Pt has hx of anxiety and panic disorder.

## 2014-06-22 NOTE — Discharge Instructions (Signed)

## 2014-06-22 NOTE — ED Provider Notes (Signed)
CSN: [id number]     Arrival date & time 06/22/14  1245 History   First MD Initiated Contact with Patient 06/22/14 1008     Chief Complaint  Patient presents with  . Anxiety     (Consider location/radiation/quality/duration/timing/severity/associated sxs/prior Treatment) HPI Comments: Patient reports long history of anxiety and panic attacks which has recently gotten worse after initiation of Prozac.  She reports wake up this morning with abdominal pain, shortness of breath, and racing heart that have resolved.  He denies any alcohol/drug use, nicotine or caffeine.  Previously treated by psychologist, but has not followed up in some time.   Patient is a 23 y.o. female presenting with anxiety. The history is provided by the patient and a parent.  Anxiety This is a chronic problem. The current episode started more than 1 year ago. The problem occurs 2 to 4 times per day. The problem has been gradually worsening. Associated symptoms include abdominal pain. Pertinent negatives include no chest pain, chills, fever, nausea, urinary symptoms or vomiting. The symptoms are aggravated by stress. She has tried nothing for the symptoms. The treatment provided no relief.    Past Medical History  Diagnosis Date  . Panic attack   . Generalized anxiety disorder 04/28/2013  . Depression 07/01/2013   History reviewed. No pertinent past surgical history. Family History  Problem Relation Age of Onset  . Anxiety disorder Mother    History  Substance Use Topics  . Smoking status: Never Smoker   . Smokeless tobacco: Never Used  . Alcohol Use: No   OB History   Grav Para Term Preterm Abortions TAB SAB Ect Mult Living                 Review of Systems  Constitutional: Negative for fever and chills.  Respiratory: Positive for chest tightness and shortness of breath.   Cardiovascular: Positive for palpitations. Negative for chest pain.  Gastrointestinal: Positive for abdominal pain. Negative for nausea  and vomiting.  Genitourinary: Negative.   Neurological: Positive for dizziness.  Psychiatric/Behavioral: Positive for sleep disturbance. The patient is nervous/anxious.       Allergies  Review of patient's allergies indicates no known allergies.  Home Medications   Prior to Admission medications   Medication Sig Start Date End Date Taking? Authorizing Provider  FLUoxetine (PROZAC) 20 MG capsule Take 20 mg by mouth every other day.   Yes Historical Provider, MD  Polyethyl Glycol-Propyl Glycol (SYSTANE) 0.4-0.3 % SOLN Apply 1 drop to eye 3 (three) times daily as needed (dry eyes).   Yes Historical Provider, MD   BP 138/86  Pulse 99  Temp(Src) 98.5 F (36.9 C) (Oral)  Resp 20  SpO2 100%  LMP 06/07/2014 Physical Exam  Vitals reviewed. Constitutional: She is oriented to person, place, and time. She appears well-developed and well-nourished.  Eyes: EOM are normal. Pupils are equal, round, and reactive to light.  Cardiovascular: Normal rate, regular rhythm and normal heart sounds.   Pulmonary/Chest: Effort normal and breath sounds normal. No respiratory distress.  Abdominal: Soft. There is no tenderness.  Neurological: She is alert and oriented to person, place, and time.    ED Course  Procedures (including critical care time) Labs Review Labs Reviewed - No data to display  Imaging Review No results found.   EKG Interpretation None      MDM   Final diagnoses:  Panic attack  Anxiety   Patient presents after panic attack this morning with palpitations, SOB, abdominal pain and dizziness  that have resolved. Recently started on Prozac. VSS.  Discharged with PCP followup tomorrow.    Olam Idler, MD 06/22/14 1037

## 2014-06-23 ENCOUNTER — Ambulatory Visit (INDEPENDENT_AMBULATORY_CARE_PROVIDER_SITE_OTHER): Payer: BC Managed Care – PPO | Admitting: Family Medicine

## 2014-06-23 ENCOUNTER — Encounter: Payer: Self-pay | Admitting: Family Medicine

## 2014-06-23 VITALS — BP 100/70 | HR 71 | Temp 98.3°F | Ht 68.0 in | Wt 145.2 lb

## 2014-06-23 DIAGNOSIS — F410 Panic disorder [episodic paroxysmal anxiety] without agoraphobia: Secondary | ICD-10-CM

## 2014-06-23 DIAGNOSIS — F411 Generalized anxiety disorder: Secondary | ICD-10-CM

## 2014-06-23 DIAGNOSIS — F331 Major depressive disorder, recurrent, moderate: Secondary | ICD-10-CM

## 2014-06-23 MED ORDER — LORAZEPAM 0.5 MG PO TABS
0.5000 mg | ORAL_TABLET | Freq: Three times a day (TID) | ORAL | Status: DC | PRN
Start: 2014-06-23 — End: 2015-08-29

## 2014-06-23 MED ORDER — HYDROXYZINE PAMOATE 50 MG PO CAPS: 50.0000 mg | ORAL_CAPSULE | Freq: Three times a day (TID) | ORAL | Status: DC | PRN

## 2014-06-23 MED ORDER — ZOLPIDEM TARTRATE 5 MG PO TABS: 5.0000 mg | ORAL_TABLET | Freq: Every evening | ORAL | Status: DC | PRN

## 2014-06-23 NOTE — Progress Notes (Signed)
Dr. Frederico Hamman T. Ashe Graybeal, MD, [facility] [place], [address] Phone: [phone] Fax: [phone]  06/23/2014  Patient: Laura Gamble, MRN: [id number], DOB: 09-29-1991, 23 y.o.  Primary Physician:  Owens Loffler, MD  Chief Complaint: Follow-up  Subjective:   Laura Gamble is a 23 y.o. very pleasant female patient who presents with the following:  F/u ER. Panic attack. Yesterday morning.   Woke up in 3 AM, a nightmare triggered it and stomach was hurting and got dizzy and lightheaded. Then the same 8 AM, and went to the ER and had a really bad panic attack. Did not sleep until 6 AM this morning. Backed off on her prozac dose to only every other day dosing.   Had some palpitations. Joining group therapy. Off [place]. Going back to counselling.  Today she is feeling better. Here with her boyfriend.   Also URI symptoms.   Past Medical History, Surgical History, Social History, Family History, Problem List, Medications, and Allergies have been reviewed and updated if relevant.   GEN: URI, no fevers, chills. GI: nausea has improved, eating normally Pulm: No SOB Interactive and getting along well at home.  Otherwise, ROS is as per the HPI.  Objective:   BP 100/70  Pulse 71  Temp(Src) 98.3 F (36.8 C) (Oral)  Ht 5\' 8"  (1.727 m)  Wt 145 lb 4 oz (65.885 kg)  BMI 22.09 kg/m2  LMP 06/07/2014  Gen: WDWN, NAD; A & O x3, cooperative. Pleasant.Globally Non-toxic HEENT: Normocephalic and atraumatic. Throat clear, w/o exudate, R TM clear, L TM - good landmarks, No fluid present. rhinnorhea.  MMM Frontal sinuses: NT Max sinuses: NT NECK: Anterior cervical  LAD is absent CV: RRR, No M/G/R, cap refill <2 sec PULM: Breathing comfortably in no respiratory distress. no wheezing, crackles, rhonchi EXT: No c/c/e PSYCH: Friendly, good eye contact MSK: Nml gait  Laboratory and Imaging Data:  Assessment and Plan:    Panic attack  Generalized anxiety disorder  Major depressive disorder, recurrent episode, moderate  Major panic attack with GAD Emergent ativan given And Vistaril. Ambien right now acutely  Follow-up: 3-4 weeks  New Prescriptions   LORAZEPAM (ATIVAN) 0.5 MG TABLET    Take 1 tablet (0.5 mg total) by mouth every 8 (eight) hours as needed for anxiety.   ZOLPIDEM (AMBIEN) 5 MG TABLET    Take 1 tablet (5 mg total) by mouth at bedtime as needed for sleep.   No orders of the defined types were placed in this encounter.    Signed,  Maud Deed. Aarsh Fristoe, MD   Patient's Medications  New Prescriptions   LORAZEPAM (ATIVAN) 0.5 MG TABLET    Take 1 tablet (0.5 mg total) by mouth every 8 (eight) hours as needed for anxiety.   ZOLPIDEM (AMBIEN) 5 MG TABLET    Take 1 tablet (5 mg total) by mouth at bedtime as needed for sleep.  Previous Medications   FLUOXETINE (PROZAC) 20 MG CAPSULE    Take 20 mg by mouth every other day.   POLYETHYL GLYCOL-PROPYL GLYCOL (SYSTANE) 0.4-0.3 % SOLN    Apply 1 drop to eye 3 (three) times daily as needed (dry eyes).  Modified Medications   Modified Medication Previous Medication   HYDROXYZINE (VISTARIL) 50 MG CAPSULE hydrOXYzine (VISTARIL) 50 MG capsule      Take 1 capsule (50 mg total) by mouth 3 (three) times daily as needed for anxiety.    Take 1 capsule (  50 mg total) by mouth 3 (three) times daily as needed for anxiety.  Discontinued Medications   No medications on file

## 2014-06-23 NOTE — Progress Notes (Signed)
Pre visit review using our clinic review tool, if applicable. No additional management support is needed unless otherwise documented below in the visit note. 

## 2014-06-29 ENCOUNTER — Ambulatory Visit: Payer: BC Managed Care – PPO | Admitting: Psychology

## 2014-06-30 ENCOUNTER — Ambulatory Visit (INDEPENDENT_AMBULATORY_CARE_PROVIDER_SITE_OTHER): Payer: BC Managed Care – PPO | Admitting: Psychology

## 2014-06-30 ENCOUNTER — Ambulatory Visit (INDEPENDENT_AMBULATORY_CARE_PROVIDER_SITE_OTHER): Payer: BC Managed Care – PPO | Admitting: Internal Medicine

## 2014-06-30 ENCOUNTER — Encounter: Payer: Self-pay | Admitting: Internal Medicine

## 2014-06-30 VITALS — BP 118/55 | HR 81 | Temp 97.4°F | Wt 144.0 lb

## 2014-06-30 DIAGNOSIS — J018 Other acute sinusitis: Secondary | ICD-10-CM

## 2014-06-30 DIAGNOSIS — F411 Generalized anxiety disorder: Secondary | ICD-10-CM

## 2014-06-30 DIAGNOSIS — J019 Acute sinusitis, unspecified: Secondary | ICD-10-CM | POA: Insufficient documentation

## 2014-06-30 MED ORDER — AMOXICILLIN 500 MG PO TABS: 1000.0000 mg | ORAL_TABLET | Freq: Two times a day (BID) | ORAL | Status: DC

## 2014-06-30 NOTE — Progress Notes (Signed)
Pre visit review using our clinic review tool, if applicable. No additional management support is needed unless otherwise documented below in the visit note. 

## 2014-06-30 NOTE — Progress Notes (Signed)
   Subjective:    Patient ID: Laura Gamble, female    DOB: March 28, 1991, 23 y.o.   MRN: [id number]  HPI Cold symptoms have continued and progressed Low grade fever Lots of head congestion Ears feel clogged  Lots of nasal drainage--mostly clear. 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 FLUoxetine (PROZAC) 20 MG capsule Take 20 mg by mouth every other day.      . hydrOXYzine (VISTARIL) 50 MG capsule Take 1 capsule (50 mg total) by mouth 3 (three) times daily as needed for anxiety.  30 capsule  5  . LORazepam (ATIVAN) 0.5 MG tablet Take 1 tablet (0.5 mg total) by mouth every 8 (eight) hours as needed for anxiety.  30 tablet  1  . Polyethyl Glycol-Propyl Glycol (SYSTANE) 0.4-0.3 % SOLN Apply 1 drop to eye 3 (three) times daily as needed (dry eyes).      Marland Kitchen zolpidem (AMBIEN) 5 MG tablet Take 1 tablet (5 mg total) by mouth at bedtime as needed for sleep.  30 tablet  0   No current facility-administered medications on file prior to visit.    No Known Allergies  Past Medical History  Diagnosis Date  . Panic attack   . Generalized anxiety disorder 04/28/2013  . Depression 07/01/2013    No past surgical history on file.  Family History  Problem Relation Age of Onset  . Anxiety disorder Mother     History   Social History  . Marital Status: Single    Spouse Name: N/A    Number of Children: N/A  . Years of Education: N/A   Occupational History  . Not on file.   Social History Main Topics  . Smoking status: Never Smoker   . Smokeless tobacco: Never Used  . Alcohol Use: No  . Drug Use: No  . Sexual Activity: Not on file   Other Topics Concern  . Not on file   Social History Narrative   student   Review of  Systems Bumpy feel on face-- slight rash from fever? Gone now Diarrhea yesterday-- feels gassy No vomiting Appetite is off      Objective:   Physical Exam  Constitutional: She appears well-developed and well-nourished. No distress.  HENT:  Mouth/Throat: Oropharynx is clear and moist. No oropharyngeal exudate.  No sinus tenderness Moderate nasal inflammation TMs normal  Neck: Normal range of motion. Neck supple. No thyromegaly present.  Non tender anterior cervical nodes  Pulmonary/Chest: Effort normal and breath sounds normal. No respiratory distress. She has no wheezes. She has no rales.          Assessment & Plan:

## 2014-06-30 NOTE — Assessment & Plan Note (Signed)
Has been sick well more than a week Worsening, some systemic symptoms Discussed supportive care amoxil

## 2014-07-07 ENCOUNTER — Telehealth: Payer: Self-pay | Admitting: Family Medicine

## 2014-07-07 NOTE — Telephone Encounter (Signed)
Yes, she can increase to daily dosing.   And if she feels really anxious, take one of the LORAZEPAM that I gave her. It will make it better

## 2014-07-07 NOTE — Telephone Encounter (Signed)
Laura Gamble notified as instructed by telephone.  She states since she got her answer, we can cancel her appointment for tomorrow.  She states she is actually feeling a little better.  Will call back tomorrow if things change and she wants to be seen.

## 2014-07-07 NOTE — Telephone Encounter (Signed)
Patient Information:  Caller Name: [facility]  Phone: [phone]  Patient: Laura Gamble  Gender: Female  DOB: July 19, 1991  Age: 23 Years  PCP: Owens Loffler [facility])  Pregnant: No  Office Follow Up:  Does the office need to follow up with this patient?: No  Instructions For The Office: N/Gamble   Symptoms  Reason For Call & Symptoms: Pt was placed on Prozac 20mg  QOD - 3 weeks ago on 06/16/14. Pt has had ongoing anxiety. She sees Dr. Rexene Edison /psychologist also/her next appt is 06/30/14. Pt started on daily Prozac and she felt less anxiety but had headaches and nausea. She then went to every other day and now is feeling very anxious on Gamble daily basis. The past 3 days (9/28 - 9/30). Pt has felt axious all day/each day. Heart rate this am is 102. While speaking with the nurse it came down to low 90's. She wants to know if she can increase it to daily again/she would rather have the side effects if it lessens her anxiety. Requested an appt with Dr. Edilia Bo. (she works today).  Reviewed Health History In EMR: Yes  Reviewed Medications In EMR: Yes  Reviewed Allergies In EMR: Yes  Reviewed Surgeries / Procedures: Yes  Date of Onset of Symptoms: 07/05/2014 OB / GYN:  LMP: Unknown  Guideline(s) Used:  Heart Rate and Heartbeat Questions  Disposition Per Guideline:   See within 3 days  Reason For Disposition Reached:   Patient wants to be seen  Advice Given:  N/Gamble  Patient Will Follow Care Advice:  YES  Appointment Scheduled:  07/08/2014 16:45:00 Appointment Scheduled Provider:  Owens Loffler (Family Practice)

## 2014-07-08 ENCOUNTER — Ambulatory Visit: Payer: Self-pay | Admitting: Family Medicine

## 2014-07-19 ENCOUNTER — Telehealth: Payer: Self-pay | Admitting: Family Medicine

## 2014-07-19 NOTE — Telephone Encounter (Signed)
Left message for Umaiza to return my call

## 2014-07-19 NOTE — Telephone Encounter (Signed)
Sarahmarie notified as instructed by telephone.  Appointment scheduled for 07/26/2014 at 4:00 pm to follow up with Dr. Lorelei Pont.

## 2014-07-19 NOTE — Telephone Encounter (Signed)
Note posted upon request of triaging nurse Rella Larve due to not having access to patient chart - MTS/CAN  Patient Information: Caller Name: [facility] Phone: [phone] Patient: Milana Kidney A Gender: Female DOB: 1990/10/31 Age: 23 Years PCP: Owens Loffler (Family Practice) Pregnant: No  Office Follow Up: Does the office need to follow up with this patient?: Yes Instructions For The Office: Needs advice - is the Prozac causing weight loss of 12 lbs over two weeks normal? Pt started Prozac 4-5 weeks ago every other day, but now is taking it every day for 2 wks. Is this normal and does she need an office visit? Please call her to advise.  RN Note: Pt has had 12 lb weight loss in the last two weeks, since she is taking prozac every day in the the last two weeks. Is this normal? Does she need to be seen?  Symptoms Reason For Call & Symptoms: Pt called, on prozac for 4-5 wks and now feels better however has weight loss of 12 lbs  since started. Is that from the prozac? Reviewed Health History In EMR: Yes Reviewed Medications In EMR: Yes Reviewed Allergies In EMR: Yes Reviewed Surgeries / Procedures: Yes Date of Onset of Symptoms: 07/04/2014 OB / GYN: LMP: 07/12/2014  Guideline(s) Used: Depression  Disposition Per Guideline:   Home Care  Reason For Disposition Reached:   Mild depression  Advice Given: Call Back If: You become worse.  Patient Will Follow Care Advice: YES

## 2014-07-19 NOTE — Telephone Encounter (Signed)
Why don't we have her f/u in the next week or so for a recheck.  Weight gain or loss can relate to depression or anxiety - Prozac it can cause some decreased appetite. It is less associated with weight gain or loss compared to some.

## 2014-07-23 ENCOUNTER — Other Ambulatory Visit: Payer: Self-pay

## 2014-07-26 ENCOUNTER — Ambulatory Visit: Payer: BC Managed Care – PPO | Admitting: Family Medicine

## 2014-07-28 ENCOUNTER — Ambulatory Visit: Payer: BC Managed Care – PPO | Admitting: Psychology

## 2014-08-05 ENCOUNTER — Encounter: Payer: Self-pay | Admitting: Family Medicine

## 2014-08-05 ENCOUNTER — Ambulatory Visit (INDEPENDENT_AMBULATORY_CARE_PROVIDER_SITE_OTHER): Payer: BC Managed Care – PPO | Admitting: Family Medicine

## 2014-08-05 VITALS — BP 119/70 | HR 77 | Temp 98.3°F | Ht 68.0 in | Wt 144.8 lb

## 2014-08-05 DIAGNOSIS — F410 Panic disorder [episodic paroxysmal anxiety] without agoraphobia: Secondary | ICD-10-CM

## 2014-08-05 DIAGNOSIS — F411 Generalized anxiety disorder: Secondary | ICD-10-CM

## 2014-08-05 DIAGNOSIS — F3341 Major depressive disorder, recurrent, in partial remission: Secondary | ICD-10-CM

## 2014-08-05 MED ORDER — PROPRANOLOL HCL 10 MG PO TABS: ORAL_TABLET | ORAL | Status: DC

## 2014-08-05 NOTE — Progress Notes (Signed)
Dr. Frederico Hamman T. Naim Murtha, MD, [facility] [place], [address] Phone: [phone] Fax: [phone]  08/05/2014  Patient: Laura Gamble, MRN: [id number], DOB: 06/12/1991, 23 y.o.  Primary Physician:  Owens Loffler, MD  Chief Complaint: Follow-up  Subjective:   Laura Gamble is a 23 y.o. 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  Prozac - gotten used to it.  Prozac every day now.     Past Medical History, Surgical History, Social History, Family History, Problem List, Medications, and Allergies have been reviewed and updated if relevant.   GEN: No acute illnesses, no fevers, chills. GI: No n/v/d, eating normally Pulm: No SOB Interactive and getting along well at home.  Otherwise, ROS is as per the HPI.  Objective:   BP 119/70  Pulse 77  Temp(Src) 98.3 F (36.8 C) (Oral)  Ht 5\' 8"  (1.727 m)  Wt 144 lb 12 oz (65.658 kg)  BMI 22.01 kg/m2  LMP 07/31/2014   GEN: WDWN, NAD, Non-toxic, Alert & Oriented x 3 HEENT: Atraumatic, Normocephalic.  Ears and Nose: No external deformity. EXTR: No clubbing/cyanosis/edema NEURO: Normal gait.  PSYCH: Normally interactive. Conversant. Not depressed or anxious appearing.  Calm demeanor.   Laboratory and Imaging Data:  Assessment and Plan:   Generalized anxiety disorder  Panic attack  Major depressive disorder, recurrent episode, in partial remission  >15 minutes spent in face to face time with patient, >50% spent in counselling or coordination of care: Initially some nausea. She has now titrated up to 20 mg of Prozac daily.  She feels the best that she has  In years.  She is also taking some Ativan  Several times a week.  She has a new job, and she is very excited.   She has the same boyfriend.  Some social anxiety persists.  Trial of Inderal prior to social engagements.  Follow-up: No Follow-up on file.  New Prescriptions   PROPRANOLOL (INDERAL) 10 MG TABLET    Take 1 tab po 30 minutes before social situations   No orders of the defined types were placed in this encounter.    Signed,  Maud Deed. Tysean Vandervliet, MD   Patient's Medications  New Prescriptions   PROPRANOLOL (INDERAL) 10 MG TABLET    Take 1 tab po 30 minutes before social situations  Previous Medications   FLUOXETINE (PROZAC) 20 MG CAPSULE    Take 20 mg by mouth every other day.   HYDROXYZINE (VISTARIL) 50 MG CAPSULE    Take 1 capsule (50 mg total) by mouth 3 (three) times daily as needed for anxiety.   LORAZEPAM (ATIVAN) 0.5 MG TABLET    Take 1 tablet (0.5 mg total) by mouth every 8 (eight) hours as needed for anxiety.  Modified Medications   No medications on file  Discontinued Medications   AMOXICILLIN (AMOXIL) 500 MG TABLET    Take 2 tablets (1,000 mg total) by mouth 2 (two) times daily.   POLYETHYL GLYCOL-PROPYL GLYCOL (SYSTANE) 0.4-0.3 % SOLN    Apply 1 drop to eye 3 (three) times daily as needed (dry eyes).   ZOLPIDEM (AMBIEN) 5 MG TABLET    Take 1 tablet (5 mg total) by mouth at bedtime as needed for sleep.

## 2014-08-05 NOTE — Progress Notes (Signed)
Pre visit review using our clinic review tool, if applicable. No additional management support is needed unless otherwise documented below in the visit note. 

## 2014-08-10 ENCOUNTER — Ambulatory Visit (INDEPENDENT_AMBULATORY_CARE_PROVIDER_SITE_OTHER): Payer: BC Managed Care – PPO | Admitting: Psychology

## 2014-08-10 DIAGNOSIS — F411 Generalized anxiety disorder: Secondary | ICD-10-CM

## 2014-08-25 ENCOUNTER — Ambulatory Visit: Payer: BC Managed Care – PPO | Admitting: Psychology

## 2014-09-07 ENCOUNTER — Encounter: Payer: Self-pay | Admitting: Family Medicine

## 2014-09-07 ENCOUNTER — Ambulatory Visit (INDEPENDENT_AMBULATORY_CARE_PROVIDER_SITE_OTHER)
Admission: RE | Admit: 2014-09-07 | Discharge: 2014-09-07 | Disposition: A | Discharge status: Home / Self Care | Payer: PRIVATE HEALTH INSURANCE | Source: Ambulatory Visit | Attending: Family Medicine | Admitting: Family Medicine

## 2014-09-07 ENCOUNTER — Ambulatory Visit: Payer: BC Managed Care – PPO | Admitting: Psychology

## 2014-09-07 ENCOUNTER — Ambulatory Visit (INDEPENDENT_AMBULATORY_CARE_PROVIDER_SITE_OTHER): Payer: PRIVATE HEALTH INSURANCE | Admitting: Family Medicine

## 2014-09-07 ENCOUNTER — Telehealth: Payer: Self-pay

## 2014-09-07 VITALS — BP 116/68 | HR 83 | Temp 98.3°F | Ht 68.0 in | Wt 148.5 lb

## 2014-09-07 DIAGNOSIS — M542 Cervicalgia: Secondary | ICD-10-CM

## 2014-09-07 DIAGNOSIS — N912 Amenorrhea, unspecified: Secondary | ICD-10-CM

## 2014-09-07 DIAGNOSIS — M436 Torticollis: Secondary | ICD-10-CM

## 2014-09-07 MED ORDER — CYCLOBENZAPRINE HCL 10 MG PO TABS: 10.0000 mg | ORAL_TABLET | Freq: Every evening | ORAL | Status: DC | PRN

## 2014-09-07 MED ORDER — DICLOFENAC SODIUM 75 MG PO TBEC: 75.0000 mg | DELAYED_RELEASE_TABLET | Freq: Two times a day (BID) | ORAL | Status: DC

## 2014-09-07 NOTE — Patient Instructions (Signed)
Start diclofenac 75 mg twice daily for pain and inflammation. Start flexeril at bedtime. Start heat, massage, gentle stretching. Stop at X-ray on way out, we will call with those results. Follow up with Dr. Lorelei Pont in 2 weeks for re-eval if not improved.

## 2014-09-07 NOTE — Progress Notes (Signed)
   Subjective:    Patient ID: Laura Gamble, female    DOB: May 16, 1991, 23 y.o.   MRN: [id number]  HPI   23 year old female presents with new onset  right sided neck pain. She woke up with inability to move neck. She has pain down right arm occ, more mild.  Slight tingling in right arm. No weakness. Pain 9/10 on pain scale.  Some mild headache.  She has anxiety and is somewhat anxious about this.  No fever.  She has not taken any med for it. She has applied heat. No benefit.    Last night she did arm exercises with 15 lb weights.  No recent injury or MVA.   She has had similar episodes in past . Last episode 3 months ago.  Has history of thoracic back pain.  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   Review of Systems  Constitutional: Negative for fever and fatigue.  HENT: Negative for ear pain.   Eyes: Negative for pain.  Respiratory: Negative for chest tightness and shortness of breath.   Cardiovascular: Negative for chest pain, palpitations and leg swelling.  Gastrointestinal: Negative for abdominal pain.  Genitourinary: Negative for dysuria.       Objective:   Physical Exam  Constitutional: Vital signs are normal. She appears well-developed and well-nourished. She is cooperative.  Non-toxic appearance. She does not appear ill. No distress.  HENT:  Head: Normocephalic.  Right Ear: Hearing, tympanic membrane, external ear and ear canal normal. Tympanic membrane is not erythematous, not retracted and not bulging.  Left Ear: Hearing, tympanic membrane, external ear and ear canal normal. Tympanic membrane is not erythematous, not retracted and not bulging.  Nose: No mucosal edema or rhinorrhea. Right sinus exhibits no maxillary sinus tenderness and no frontal sinus tenderness. Left sinus exhibits no maxillary sinus tenderness and no frontal sinus tenderness.  Mouth/Throat: Uvula is midline, oropharynx is clear and moist and mucous membranes are normal.  Eyes: Conjunctivae, EOM and  lids are normal. Pupils are equal, round, and reactive to light. Lids are everted and swept, no foreign bodies found.  Neck: Trachea normal. Neck supple. Muscular tenderness present. No spinous process tenderness present. Carotid bruit is not present. Decreased range of motion present. No thyroid mass and no thyromegaly present.  Cardiovascular: Normal rate, regular rhythm, S1 normal, S2 normal, normal heart sounds, intact distal pulses and normal pulses.  Exam reveals no gallop and no friction rub.   No murmur heard. Pulmonary/Chest: Effort normal and breath sounds normal. No tachypnea. No respiratory distress. She has no decreased breath sounds. She has no wheezes. She has no rhonchi. She has no rales.  Abdominal: Soft. Normal appearance and bowel sounds are normal. There is no tenderness.  Musculoskeletal:       Cervical back: She exhibits decreased range of motion and tenderness. She exhibits no bony tenderness.  ttp over right trapezius, pt with torticollis and muscle spasm, unable to move neck much, turned to the right  Neurological: She is alert.  Skin: Skin is warm, dry and intact. No rash noted.  Psychiatric: Her speech is normal and behavior is normal. Judgment and thought content normal. Her mood appears not anxious. Cognition and memory are normal. She does not exhibit a depressed mood.          Assessment & Plan:

## 2014-09-07 NOTE — Telephone Encounter (Signed)
Ashland from [place] said pt on phone due to neck pain and rt arm numbness;pt was lifting 10-15 lbs weights last night and suddenly developed severe neck pain while lifting weights; today pt has neck pain (pain scale 8) and numbness and pain in rt arm. Juliann Pulse advised ED disposition but pt cannot afford to go to UC or ED; pt does want to be seen today due to pain and hurts worse when moves neck. Pt said this is the 3rd time having neck pain after lifting some type of weight.; last episode was about 1 month ago. Dr Diona Browner will see pt today at 4pm. Pt very appreciative.advised pt if condition changes or worsens prior to appt to cb. Pt voiced understanding.

## 2014-09-07 NOTE — Progress Notes (Signed)
Pre visit review using our clinic review tool, if applicable. No additional management support is needed unless otherwise documented below in the visit note. 

## 2014-09-07 NOTE — Addendum Note (Signed)
Addended by: Eliezer Lofts E on: 09/07/2014 05:03 PM   Modules accepted: Orders

## 2014-09-09 ENCOUNTER — Ambulatory Visit (INDEPENDENT_AMBULATORY_CARE_PROVIDER_SITE_OTHER): Payer: PRIVATE HEALTH INSURANCE | Admitting: Family Medicine

## 2014-09-09 ENCOUNTER — Telehealth: Payer: Self-pay

## 2014-09-09 ENCOUNTER — Encounter: Payer: Self-pay | Admitting: Family Medicine

## 2014-09-09 VITALS — BP 116/76 | HR 84 | Temp 98.5°F | Wt 147.5 lb

## 2014-09-09 DIAGNOSIS — M436 Torticollis: Secondary | ICD-10-CM

## 2014-09-09 MED ORDER — DIAZEPAM 5 MG PO TABS: 2.5000 mg | ORAL_TABLET | Freq: Two times a day (BID) | ORAL | Status: DC | PRN

## 2014-09-09 MED ORDER — TRAMADOL HCL 50 MG PO TABS: 25.0000 mg | ORAL_TABLET | Freq: Two times a day (BID) | ORAL | Status: DC | PRN

## 2014-09-09 NOTE — Telephone Encounter (Signed)
PLEASE NOTE: All timestamps contained within this report are represented as [place] Standard Time. CONFIDENTIALTY NOTICE: This fax transmission is intended only for the addressee. It contains information that is legally privileged, confidential or otherwise protected from use or disclosure. If you are not the intended recipient, you are strictly prohibited from reviewing, disclosing, copying using or disseminating any of this information or taking any action in reliance on or regarding this information. If you have received this fax in error, please notify us immediately by telephone so that we can arrange for its return to [place]. Phone: [phone], Toll-Free: [phone], Fax: [phone] Page: 1 of 2 Call Id: [id number] [place] Patient Name: Laura Gamble Gender: Female DOB: 10/18/1990 Age: 23 Y 58 M 20 D Return Phone Number: [phone] (Primary) Address: [address] City/State/Zip: [place] [address] Client [facility] - Client Client Site [place] - Day Physician Diona Browner, [place] Contact Type Call Call Type Triage / Clinical Relationship To Patient Self Return Phone Number [phone] (Primary) Chief Complaint Shoulder Injury Initial Comment Caller states she saw MD for shoulder. Pain has gotten worse, now swollen. PreDisposition Go to Urgent Care/Walk-In Clinic Nurse Assessment Nurse: Markus Daft, RN, Sherre Poot Date/Time Eilene Ghazi Time): 09/09/2014 9:06:48 AM Confirm and document reason for call. If symptomatic, describe symptoms. ---Caller states she saw MD in office for right side of neck/right shoulder injury/muscle spasm on Tuesday after exercising it has happened. Taking anti-inflammatory and muscle relaxers. Pain has gotten worse, now swollen. No fever. Has the patient traveled out of the country within the last 30 days? ---Not  Applicable Does the patient require triage? ---Yes Related visit to physician within the last 2 weeks? ---Yes Does the PT have any chronic conditions? (i.e. diabetes, asthma, etc.) ---Yes List chronic conditions. ---PTSD, Anxiety Did the patient indicate they were pregnant? ---No Guidelines Guideline Title Affirmed Question Affirmed Notes Nurse Date/Time Eilene Ghazi Time) Shoulder Pain [1] SEVERE pain AND [2] not improved 2 hours after pain medicine Markus Daft, RN, Sherre Poot 09/09/2014 9:09:34 AM Disp. Time Eilene Ghazi Time) Disposition Final User 09/09/2014 9:10:59 AM Go to ED Now (or PCP triage) Yes Markus Daft, RN, Kenton Kingfisher Understands: Yes Disagree/Comply: Comply PLEASE NOTE: All timestamps contained within this report are represented as [place] Standard Time. CONFIDENTIALTY NOTICE: This fax transmission is intended only for the addressee. It contains information that is legally privileged, confidential or otherwise protected from use or disclosure. If you are not the intended recipient, you are strictly prohibited from reviewing, disclosing, copying using or disseminating any of this information or taking any action in reliance on or regarding this information. If you have received this fax in error, please notify us immediately by telephone so that we can arrange for its return to [place]. Phone: [phone], Toll-Free: [phone], Fax: [phone] Page: 2 of 2 Call Id: [id number] Care Advice Given Per Guideline GO TO ED NOW (OR PCP TRIAGE): * IF NO PCP TRIAGE: You need to be seen. Go to the [facility] at _[facility] Hospital within the next hour. Leave as soon as you can. PAIN MEDICINES: as prescribed. CARE ADVICE given per Shoulder Pain (Adult) guideline Comments User: Mayford Knife, RN Date/Time Eilene Ghazi Time): 09/09/2014 9:15:51 AM After 3 attempts, RN not able to reach staff to see about appt in office within next hr. - RN advised that pt call back to office again and try to see if any MD can  see her. If  not, then she is aware to go to UC or ER. Referrals REFERRED TO PCP OFFICE

## 2014-09-09 NOTE — Telephone Encounter (Signed)
Pt called for possible appt; Dr Darnell Level said will work pt in today at 1:30pm. Pt voiced understanding and if condition changes or worsens prior to appt pt will go to UC.

## 2014-09-09 NOTE — Telephone Encounter (Signed)
PLEASE NOTE: All timestamps contained within this report are represented as [place] Standard Time. CONFIDENTIALTY NOTICE: This fax transmission is intended only for the addressee. It contains information that is legally privileged, confidential or otherwise protected from use or disclosure. If you are not the intended recipient, you are strictly prohibited from reviewing, disclosing, copying using or disseminating any of this information or taking any action in reliance on or regarding this information. If you have received this fax in error, please notify us immediately by telephone so that we can arrange for its return to [place]. Phone: [phone], Toll-Free: [phone], Fax: [phone] Page: 1 of 2 Call Id: [id number] [place] Patient Name: Laura Gamble Gender: Female DOB: 1990-12-29 Age: 23 Y 33 M 20 D Return Phone Number: [phone] (Primary) Address: [address] City/State/Zip: [place] [address] Client [facility] - Client Client Site [place] - Day Physician Diona Browner, [place] Contact Type Call Call Type Triage / Clinical Relationship To Patient Self Return Phone Number [phone] (Primary) Chief Complaint Shoulder Injury Initial Comment Caller states she saw MD for shoulder. Pain has gotten worse, now swollen. PreDisposition Go to Urgent Care/Walk-In Clinic Nurse Assessment Nurse: Markus Daft, RN, Sherre Poot Date/Time Eilene Ghazi Time): 09/09/2014 9:06:48 AM Confirm and document reason for call. If symptomatic, describe symptoms. ---Caller states she saw MD in office for right side of neck/right shoulder injury/muscle spasm on Tuesday after exercising it has happened. Taking anti-inflammatory and muscle relaxers. Pain has gotten worse, now swollen. No fever. Has the patient traveled out of the country within the last 30 days? ---Not  Applicable Does the patient require triage? ---Yes Related visit to physician within the last 2 weeks? ---Yes Does the PT have any chronic conditions? (i.e. diabetes, asthma, etc.) ---Yes List chronic conditions. ---PTSD, Anxiety Did the patient indicate they were pregnant? ---No Guidelines Guideline Title Affirmed Question Affirmed Notes Nurse Date/Time Eilene Ghazi Time) Shoulder Pain [1] SEVERE pain AND [2] not improved 2 hours after pain medicine Markus Daft, RN, Sherre Poot 09/09/2014 9:09:34 AM Disp. Time Eilene Ghazi Time) Disposition Final User 09/09/2014 9:10:59 AM Go to ED Now (or PCP triage) Yes Markus Daft, RN, Kenton Kingfisher Understands: Yes Disagree/Comply: Comply PLEASE NOTE: All timestamps contained within this report are represented as [place] Standard Time. CONFIDENTIALTY NOTICE: This fax transmission is intended only for the addressee. It contains information that is legally privileged, confidential or otherwise protected from use or disclosure. If you are not the intended recipient, you are strictly prohibited from reviewing, disclosing, copying using or disseminating any of this information or taking any action in reliance on or regarding this information. If you have received this fax in error, please notify us immediately by telephone so that we can arrange for its return to [place]. Phone: [phone], Toll-Free: [phone], Fax: [phone] Page: 2 of 2 Call Id: [id number] Care Advice Given Per Guideline GO TO ED NOW (OR PCP TRIAGE): * IF NO PCP TRIAGE: You need to be seen. Go to the [facility] at [facility] Hospital within the next hour. Leave as soon as you can. PAIN MEDICINES: as prescribed. CARE ADVICE given per Shoulder Pain (Adult) guideline Comments User: Mayford Knife, RN Date/Time Eilene Ghazi Time): 09/09/2014 9:15:51 AM After 3 attempts, RN not able to reach staff to see about appt in office within next hr. - RN advised that pt call back to office again and try to see if any MD can  see her. If  not, then she is aware to go to UC or ER. Referrals REFERRED TO PCP OFFICE

## 2014-09-09 NOTE — Progress Notes (Signed)
BP 116/76 mmHg  Pulse 84  Temp(Src) 98.5 F (36.9 C) (Oral)  Wt 147 lb 8 oz (66.906 kg)  LMP 08/11/2014   CC: check neck/shoulder  Subjective:    Patient ID: Laura Gamble, female    DOB: 10/26/1990, 23 y.o.   MRN: [id number]  HPI: Laura Gamble is a 23 y.o. female presenting on 09/09/2014 for Neck/Shoulder pain   Seen by PCP 2 days ago with acute neck pain upon awakening, thought acute torticollis, not improved with heat. Treated with  No fevers/chills, denies inciting trauma/injury. H/o similar episode in past usually after works out shoulder. H/o anxiety - on fluoxetine daily and ativan prn (hasn't needed in last several weeks). Studying for C[employer], works at [employer] without current heavy lifting. Treated with voltaren 75mg  bid and flexeril qhs prn. rec f/u with SM in 2 wks if no improvement noted. Xray reassuring.  Voltaren and flexeril haven't really helped, mainly causing drowsiness.  Sleeping on 1 pillow. Endorses persistent R sided neck pain and swelling, R arm pain and some tingling/weakness of neck. Some headache yesterday - muscular starts at R occiput with radiation to forehead.   Did have sinus infection 2 weeks ago, now improving.  CERVICAL SPINE 4+ VIEWS COMPARISON: None. FINDINGS: The lateral view images through bottom of T1. Prevertebral soft tissues are within normal limits. Mild convex left curvature throughout the cervical spine. Straightening and mild reversal of expected lordosis. Maintenance of vertebral body height. Open-mouth view suboptimal. Facets are well-aligned. IMPRESSION: Spinal curvature and nonspecific straightening of expected lordosis. This could be positional or due to muscular spasm. No acute osseous abnormality. Electronically Signed  By: Abigail Miyamoto M.D.  On: 09/08/2014 08:46  Relevant past medical, surgical, family and social history reviewed and updated as indicated. Interim medical history since our last visit  reviewed. Allergies and medications reviewed and updated.  Current Outpatient Prescriptions on File Prior to Visit  Medication Sig  . diclofenac (VOLTAREN) 75 MG EC tablet Take 1 tablet (75 mg total) by mouth 2 (two) times daily.  Marland Kitchen FLUoxetine (PROZAC) 20 MG capsule Take 20 mg by mouth daily.   Marland Kitchen LORazepam (ATIVAN) 0.5 MG tablet Take 1 tablet (0.5 mg total) by mouth every 8 (eight) hours as needed for anxiety.   No current facility-administered medications on file prior to visit.    Review of Systems Per HPI unless specifically indicated above     Objective:    BP 116/76 mmHg  Pulse 84  Temp(Src) 98.5 F (36.9 C) (Oral)  Wt 147 lb 8 oz (66.906 kg)  LMP 08/11/2014  Wt Readings from Last 3 Encounters:  09/09/14 147 lb 8 oz (66.906 kg)  09/07/14 148 lb 8 oz (67.359 kg)  08/05/14 144 lb 12 oz (65.658 kg)    Physical Exam  Constitutional: She appears well-developed and well-nourished. No distress.  HENT:  Head: Normocephalic and atraumatic.  Mouth/Throat: Oropharynx is clear and moist. No oropharyngeal exudate.  Eyes: Conjunctivae and EOM are normal. Pupils are equal, round, and reactive to light.  Neck: Normal range of motion. Neck supple.  Musculoskeletal: She exhibits no edema.  Keeps neck/head tilted to right. Limited ROM to left 2/2 spasm/pain. Tender to palpation R trapezius muscle as well as spasm/tightness at R trapezius evident. No midline cervical spine tenderness FROM at shoulders. No pain at scapula, R forearm or upper arm, or rhomboids.  Lymphadenopathy:    She has no cervical adenopathy.  Neurological:  Sensation intact Neg kerdnig/bruzynski sign  Nursing note and vitals reviewed.      Assessment & Plan:   Problem List Items Addressed This Visit    Torticollis, acute - Primary    Again consistent with acute acquired torticollis after workout. Stop flexeril as ineffective, continue voltaren NSAID. Add on valium 2.5-5mg  bid prn muscle spasm, also  provided with short tramadol course, discussed taking 1/2 tab and advised against mixing with valium - no driving, no work while on med. Work note provided for 12/2-01/2014. Advised to return in next 2 weeks with PCP for further eval, update [place] sooner if any worsening or not improving as expected. Pt agrees with plan. Advised no ativan while on valium/tramadol. No red flags today.        Follow up plan: Return if symptoms worsen or fail to improve.

## 2014-09-09 NOTE — Telephone Encounter (Signed)
Will see today.  

## 2014-09-09 NOTE — Progress Notes (Signed)
Pre visit review using our clinic review tool, if applicable. No additional management support is needed unless otherwise documented below in the visit note. 

## 2014-09-09 NOTE — Patient Instructions (Addendum)
I think you have persistent neck strain/spasm. Stop flexeril. Continue voltaren tablets twice daily with food. May try valium (diazepam) muscle relaxant - take 1/2 to 1 tablet twice daily as needed. May try stronger pain medication as needed, but caution with this and valium together - start at 1/2 tablet as well. Tramadol may also cause nausea. Continue gentle stretching of neck, heating pad to neck. Schedule appointment with Dr Lorelei Pont for further evaluation given history of recurrent neck spasm.  Torticollis, Acute You have suddenly (acutely) developed a twisted neck (torticollis). This is usually a self-limited condition. CAUSES  Acute torticollis may be caused by malposition, trauma or infection. Most commonly, acute torticollis is caused by sleeping in an awkward position. Torticollis may also be caused by the flexion, extension or twisting of the neck muscles beyond their normal position. Sometimes, the exact cause may not be known. SYMPTOMS  Usually, there is pain and limited movement of the neck. Your neck may twist to one side. DIAGNOSIS  The diagnosis is often made by physical examination. X-rays, CT scans or MRIs may be done if there is a history of trauma or concern of infection. TREATMENT  For a common, stiff neck that develops during sleep, treatment is focused on relaxing the contracted neck muscle. Medications (including shots) may be used to treat the problem. Most cases resolve in several days. Torticollis usually responds to conservative physical therapy. If left untreated, the shortened and spastic neck muscle can cause deformities in the face and neck. Rarely, surgery is required. HOME CARE INSTRUCTIONS   Use over-the-counter and prescription medications as directed by your caregiver.  Do stretching exercises and massage the neck as directed by your caregiver.  Follow up with physical therapy if needed and as directed by your caregiver. SEEK IMMEDIATE MEDICAL CARE IF:    You develop difficulty breathing or noisy breathing (stridor).  You drool, develop trouble swallowing or have pain with swallowing.  You develop numbness or weakness in the hands or feet.  You have changes in speech or vision.  You have problems with urination or bowel movements.  You have difficulty walking.  You have a fever.  You have increased pain. MAKE SURE YOU:   Understand these instructions.  Will watch your condition.  Will get help right away if you are not doing well or get worse. Document Released: 09/21/2000 Document Revised: 12/17/2011 Document Reviewed: 11/02/2009 [facility] Patient Information 2015 [place], [place]. This information is not intended to replace advice given to you by your health care provider. Make sure you discuss any questions you have with your health care provider.

## 2014-09-09 NOTE — Assessment & Plan Note (Signed)
Again consistent with acute acquired torticollis after workout. Stop flexeril as ineffective, continue voltaren NSAID. Add on valium 2.5-5mg  bid prn muscle spasm, also provided with short tramadol course, discussed taking 1/2 tab and advised against mixing with valium - no driving, no work while on med. Work note provided for 12/2-01/2014. Advised to return in next 2 weeks with PCP for further eval, update [place] sooner if any worsening or not improving as expected. Pt agrees with plan. Advised no ativan while on valium/tramadol. No red flags today.

## 2014-09-29 ENCOUNTER — Ambulatory Visit: Payer: PRIVATE HEALTH INSURANCE | Admitting: Family Medicine

## 2014-10-05 ENCOUNTER — Telehealth: Payer: Self-pay

## 2014-10-05 NOTE — Telephone Encounter (Signed)
PLEASE NOTE: All timestamps contained within this report are represented as [place] Standard Time. CONFIDENTIALTY NOTICE: This fax transmission is intended only for the addressee. It contains information that is legally privileged, confidential or otherwise protected from use or disclosure. If you are not the intended recipient, you are strictly prohibited from reviewing, disclosing, copying using or disseminating any of this information or taking any action in reliance on or regarding this information. If you have received this fax in error, please notify us immediately by telephone so that we can arrange for its return to [place]. Phone: [phone], Toll-Free: [phone], Fax: [phone] Page: 1 of 2 Call Id: [id number] [place] Patient Name: Laura Gamble Gender: Female DOB: [birth year] Age: 23 Y 43 M 16 D Return Phone Number: [phone] (Primary) Address: [address] City/State/Zip: [place] [address] Client [facility] - Client Client Site [place] - Day Physician [place], [place] Contact Type Call Call Type Triage / Clinical Relationship To Patient Self Return Phone Number [phone] (Primary) Chief Complaint Fever (non urgent symptom) Initial Comment Caller states c/o aches, chills, cough, fever, congestion PreDisposition Home Care Nurse Assessment Nurse: Donalynn Furlong, RN, Myna Hidalgo Date/Time Eilene Ghazi Time): 10/05/2014 3:13:29 PM Confirm and document reason for call. If symptomatic, describe symptoms. ---Caller states c/o aches, chills, cough, fever, congestion Started getting bad at bedtime last night. No N/V/D. Temp was 100.2 orally Has the patient traveled out of the country within the last 30 days? ---No Does the patient require triage? ---Yes Related visit to physician within the last 2 weeks? ---No Does the PT have  any chronic conditions? (i.e. diabetes, asthma, etc.) ---No Did the patient indicate they were pregnant? ---No Guidelines Guideline Title Affirmed Question Affirmed Notes Nurse Date/Time (Eastern Time) Fever [1] Fever AND [2] no signs of serious infection or localizing symptoms (all other triage questions negative) Donalynn Furlong, RN, Myna Hidalgo 10/05/2014 3:15:53 PM Disp. Time Eilene Ghazi Time) Disposition Final User 10/05/2014 2:54:11 PM Send To Clinical Follow Up Rich Brave, Amy 10/05/2014 3:25:33 PM Home Care Yes Donalynn Furlong, RN, Myna Hidalgo Caller Understands: Yes Disagree/Comply: Comply PLEASE NOTE: All timestamps contained within this report are represented as [place] Standard Time. CONFIDENTIALTY NOTICE: This fax transmission is intended only for the addressee. It contains information that is legally privileged, confidential or otherwise protected from use or disclosure. If you are not the intended recipient, you are strictly prohibited from reviewing, disclosing, copying using or disseminating any of this information or taking any action in reliance on or regarding this information. If you have received this fax in error, please notify us immediately by telephone so that we can arrange for its return to [place]. Phone: [phone], Toll-Free: [phone], Fax: [phone] Page: 2 of 2 Call Id: [id number] Care Advice Given Per Guideline HOME CARE: You should be able to treat this at home. FOR ALL FEVERS: * Drink cold fluids to prevent dehydration. * Dress in 1 layer of lightweight clothing and sleep with 1 light blanket. * For fevers less than 101 F (38.3 C), fever medicines are usually not necessary. FEVER MEDICINES: * For fever relief, take acetaminophen or ibuprofen. * Treat fevers above 101 F (38.3 C). * The goal of fever therapy is to bring the fever down to a comfortable level. Remember that fever medicine usually lowers fever 2-3 F (1-1.5 C). ACETAMINOPHEN (E.G., TYLENOL): * Take 650  mg (two 325 mg pills) by mouth every  4-6 hours as needed. Each Regular Strength Tylenol pill has 325 mg of acetaminophen. The most you should take each day is 3,250 mg (10 Regular Strength pills a day). * Another choice is to take 1,000 mg (two 500 mg pills) every 8 hours as needed. Each Extra Strength Tylenol pill has 500 mg of acetaminophen. The most you should take each day is 3,000 mg (6 Extra Strength pills a day). IBUPROFEN (E.G., MOTRIN, ADVIL): * Take 400 mg (two 200 mg pills) by mouth every 6 hours as needed. NAPROXEN (E.G., ALEVE): * Take 220 mg (one 220 mg pill) by mouth every 8 hours as needed. You may take 440 mg (two 220 mg pills) for your first dose. * You may take this medicine with or without food. Taking it with food or milk may lessen the chance the drug will upset your stomach. * GASTROINTESTINAL RISK: There is an increased risk of stomach ulcers, GI bleeding, perforation. EXPECTED COURSE OF FEVER: Most fevers associated with viral illnesses fluctuate between 101 - 103 F (38.3 - 39.5 C) and last for 2 or 3 days. CALL BACK IF: * Fever goes above 104 F (40 C) and you are unable to get it down * Fever lasts over 3 days (72 hours) * You become worse. CARE ADVICE given per Fever (Adult) guideline. CAUTION - NSAIDS (E.G., IBUPROFEN, NAPROXEN): After Care Instructions Given Call Event Type User Date / Time Description

## 2014-10-07 ENCOUNTER — Telehealth: Payer: Self-pay | Admitting: Family Medicine

## 2014-10-07 NOTE — Telephone Encounter (Signed)
Pt left vm on triage requesting a note to excuse her from work on Tuesday 12/29.  She said that she called and spoke with [employer] but there were no appts left for that day and she had to wait until Wednesday for an appt.  She left work early on Tuesday 12/29.

## 2014-10-07 NOTE — Telephone Encounter (Signed)
No one from our office evaluated her.   If she would like, I can write a note saying that she was sick on Tuesday but not seen at our office, please excuse this trustworthy patient.   Unfortunately, that is about all I can say.  Please help with note on MOnday

## 2014-10-11 NOTE — Telephone Encounter (Signed)
Left message for [place] to return my call

## 2014-10-12 NOTE — Telephone Encounter (Signed)
Left message for Laura Gamble to return my call

## 2014-10-13 NOTE — Telephone Encounter (Signed)
Spoke with [place].  She states her work has already excused her absence so she no longer needs a work note.

## 2014-12-28 ENCOUNTER — Other Ambulatory Visit: Payer: Self-pay | Admitting: Family Medicine

## 2015-01-11 ENCOUNTER — Telehealth: Payer: Self-pay | Admitting: *Deleted

## 2015-01-11 NOTE — Telephone Encounter (Signed)
Patient called and stating that she has been on Prozac for about 6-7 months and seems to be doing fine with that. Patient stated that she has noticed in the last 2 weeks that she has had some bloody stools and some pain at times in the lower quadrant of her stomach that is off and on and wants to make sure that this is not associated with the Prozac?Marland Kitchen  Patient stated that she had this happen about 30 minutes ago, but feels fine now and was fine yesterday. Appointment scheduled with Dr. Damita Dunnings tomorrow 01/12/15. Patient advised that if symptoms get worse before the appointment tomorrow she is to go to the ER and she verbalized understanding.

## 2015-01-11 NOTE — Telephone Encounter (Signed)
I have about 10 openings tomorrow. I am happy to see my patient of many years, and I am sure that my partner would not have a problem with this.  Please change to my schedule.   Cc: Patrica Duel, Dr. Jackqulyn Livings

## 2015-01-11 NOTE — Telephone Encounter (Signed)
The reason the patient was scheduled with Dr. Damita Dunnings is because she wanted something late after she got off from work. Called patient back and she said that she will try to work something out with her employer to make the appointment with Dr. Lorelei Pont. Appointment changed to Dr. Lorelei Pont at 3:45.

## 2015-01-12 ENCOUNTER — Ambulatory Visit (INDEPENDENT_AMBULATORY_CARE_PROVIDER_SITE_OTHER): Payer: PRIVATE HEALTH INSURANCE | Admitting: Family Medicine

## 2015-01-12 ENCOUNTER — Ambulatory Visit: Payer: PRIVATE HEALTH INSURANCE | Admitting: Family Medicine

## 2015-01-12 ENCOUNTER — Encounter: Payer: Self-pay | Admitting: Family Medicine

## 2015-01-12 VITALS — BP 100/70 | HR 64 | Temp 98.3°F | Wt 164.4 lb

## 2015-01-12 DIAGNOSIS — K921 Melena: Secondary | ICD-10-CM | POA: Diagnosis not present

## 2015-01-12 DIAGNOSIS — F411 Generalized anxiety disorder: Secondary | ICD-10-CM

## 2015-01-12 DIAGNOSIS — F410 Panic disorder [episodic paroxysmal anxiety] without agoraphobia: Secondary | ICD-10-CM | POA: Diagnosis not present

## 2015-01-12 DIAGNOSIS — F3342 Major depressive disorder, recurrent, in full remission: Secondary | ICD-10-CM | POA: Diagnosis not present

## 2015-01-12 NOTE — Telephone Encounter (Signed)
Noted, thanks!

## 2015-01-12 NOTE — Progress Notes (Signed)
Dr. Frederico Hamman T. Diesha Rostad, MD, [facility] [place], [address] Phone: [phone] Fax: [phone]  01/12/2015  Patient: Laura Gamble, MRN: [id number], DOB: 03-29-1991, 24 y.o.  Primary Physician:  Owens Loffler, MD  Chief Complaint: Blood In Stools  Subjective:   Laura Gamble is a 24 y.o. very pleasant female patient who presents with the following:  Yesterday, called the triage. For the past two weeks, has had for the last few weeks, does not hurt. When wipe, there will be blood on the toilet paper. Not much at all. Thought maybe from period.   After using the bathrooom. Will get itchy in the bottom. No stomach pain. Panic attacks and depression is doing much better.   Has gained 20 pounds. Worried about that and if could be related to prozac.  Wt Readings from Last 3 Encounters:  01/12/15 164 lb 6.4 oz (74.571 kg)  09/09/14 147 lb 8 oz (66.906 kg)  09/07/14 148 lb 8 oz (67.359 kg)    Mom, 220. Not sure about   Small hemorrhoids.   Past Medical History, Surgical History, Social History, Family History, Problem List, Medications, and Allergies have been reviewed and updated if relevant.   GEN: No acute illnesses, no fevers, chills. GI: No n/v/d, eating normally Pulm: No SOB Interactive and getting along well at home.  Otherwise, ROS is as per the HPI.  Objective:   BP 100/70 mmHg  Pulse 64  Temp(Src) 98.3 F (36.8 C) (Oral)  Wt 164 lb 6.4 oz (74.571 kg)  SpO2 99%  LMP 01/04/2015  GEN: WDWN, NAD, Non-toxic, A & O x 3 HEENT: Atraumatic, Normocephalic. Neck supple. No masses, No LAD. Ears and Nose: No external deformity. CV: RRR, No M/G/R. No JVD. No thrill. No extra heart sounds. PULM: CTA B, no wheezes, crackles, rhonchi. No retractions. No resp. distress. No accessory muscle use. Rectal: No anal fissures. Very small ext hemorrhoid. This portion of the physical examination was chaperoned by Hedy Camara, CMA.  EXTR: No c/c/e NEURO Normal gait.  PSYCH: Normally interactive. Conversant. Not depressed or anxious appearing.  Calm demeanor.   Laboratory and Imaging Data:  Assessment and Plan:   Bloody stool  Generalized anxiety disorder  Major depressive disorder, recurrent, in full remission  Panic attack  >25 minutes spent in face to face time with patient, >50% spent in counselling or coordination of care: I reassured her.  Most likely the bloody stools or coming from an internal hemorrhoid, she occasionally will get constipated which makes it worse.  Work on fruit, vegetables, fiber, fluid.  She can use some over-the-counter medicines such as Preparation H.  She did not want to get any suppositories in case she had a bad flareup.  With her weight gain, 20 pounds, think that we have to weigh this versus how well she is doing in regards to her depression and anxiety.  She was relatively incapacitated for a while, and now she is doing great.  She is very healthy and 24 years old, so I recommended that Besan try to work on her diet and ramp up her exercise schedule to see if she can lose a little bit of weight this way and stay on Prozac.  Signed,  Maud Deed. Cinthya Bors, MD   Patient's Medications  New Prescriptions   No medications on file  Previous Medications   FLUOXETINE (PROZAC) 20 MG CAPSULE    TAKE ONE CAPSULE BY MOUTH  DAILY   LORAZEPAM (ATIVAN) 0.5 MG TABLET    Take 1 tablet (0.5 mg total) by mouth every 8 (eight) hours as needed for anxiety.  Modified Medications   No medications on file  Discontinued Medications   DIAZEPAM (VALIUM) 5 MG TABLET    Take 0.5-1 tablets (2.5-5 mg total) by mouth every 12 (twelve) hours as needed for muscle spasms.   DICLOFENAC (VOLTAREN) 75 MG EC TABLET    Take 1 tablet (75 mg total) by mouth 2 (two) times daily.   TRAMADOL (ULTRAM) 50 MG TABLET    Take 0.5-1 tablets (25-50 mg total) by mouth every 12 (twelve) hours as needed for severe  pain.

## 2015-01-12 NOTE — Progress Notes (Signed)
Pre visit review using our clinic review tool, if applicable. No additional management support is needed unless otherwise documented below in the visit note. 

## 2015-04-01 ENCOUNTER — Other Ambulatory Visit: Payer: Self-pay | Admitting: Family Medicine

## 2015-04-01 NOTE — Telephone Encounter (Signed)
OV 01/12/15.  Last filled #30 with 2 refills 12/28/14.

## 2015-07-14 IMAGING — CR DG CERVICAL SPINE COMPLETE 4+V
5 series · 5 of 5 positions shown · non-contrast
Comparison: None.

CLINICAL DATA: Acute neck pain UF2.X (AVU-8P-CM)Torticollis, acute
YMD.8 (AVU-8P-CM)

EXAM:
CERVICAL SPINE  4+ VIEWS

[view not recorded (1 of 5)]
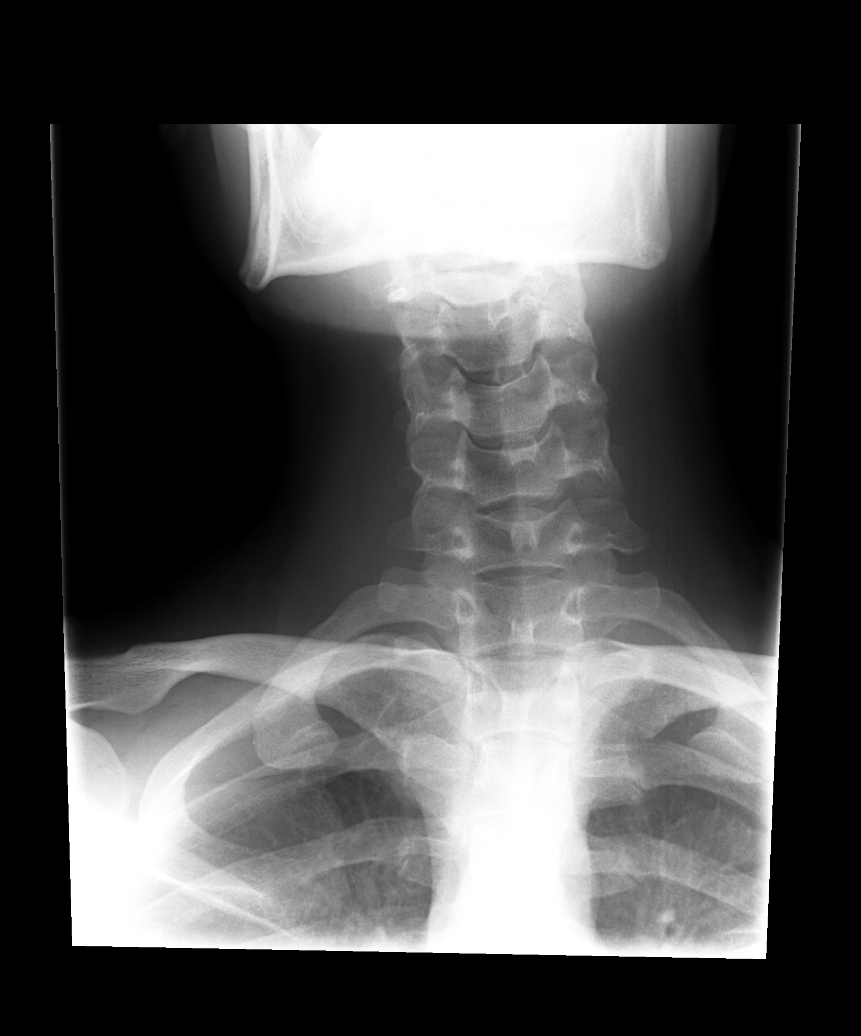

[view not recorded (2 of 5)]
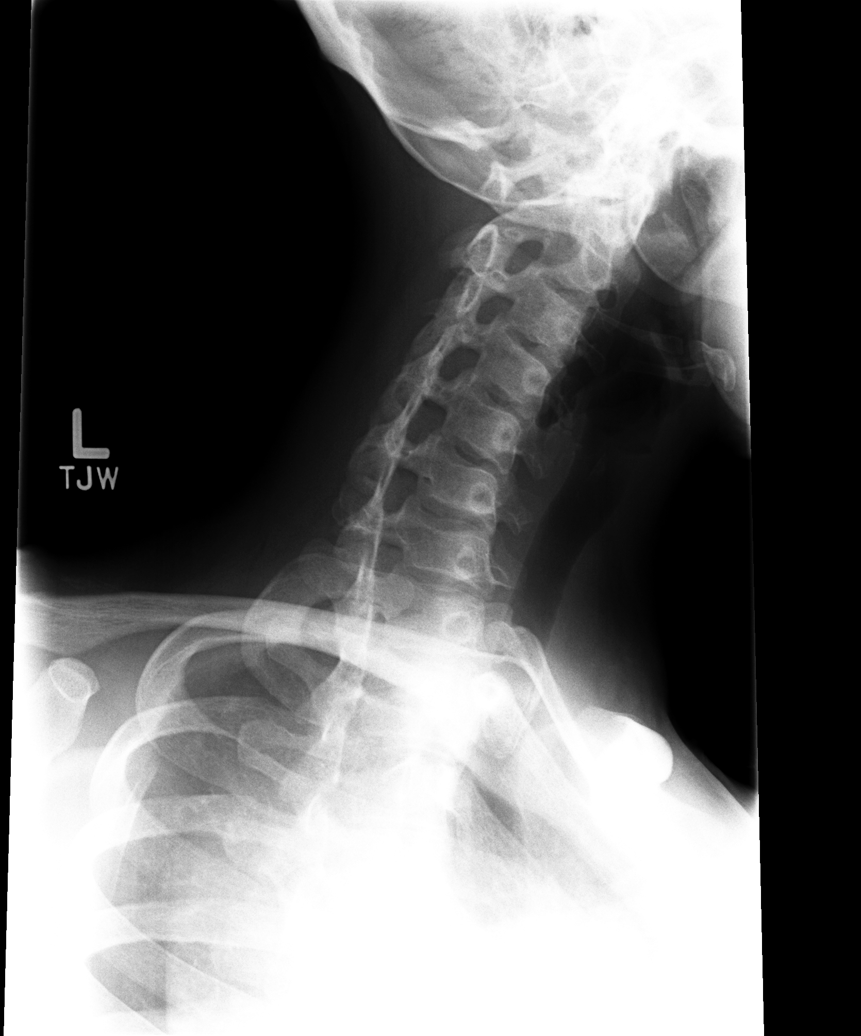

[view not recorded (3 of 5)]
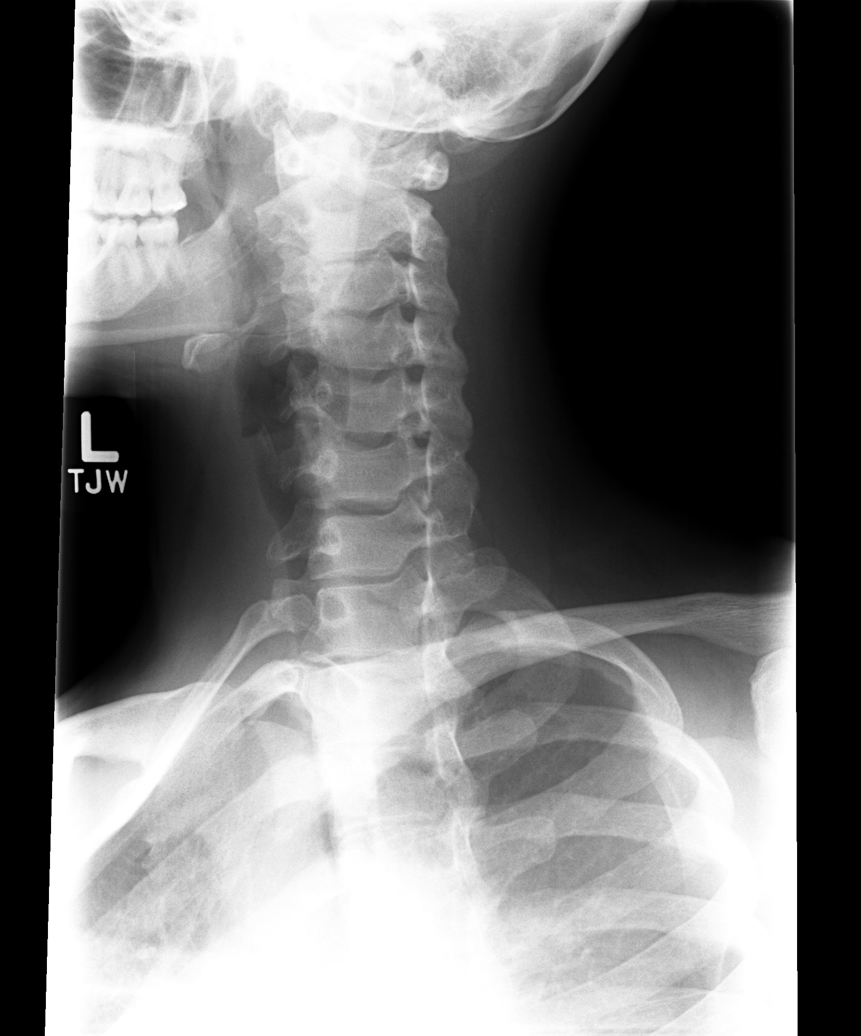

[view not recorded (4 of 5)]
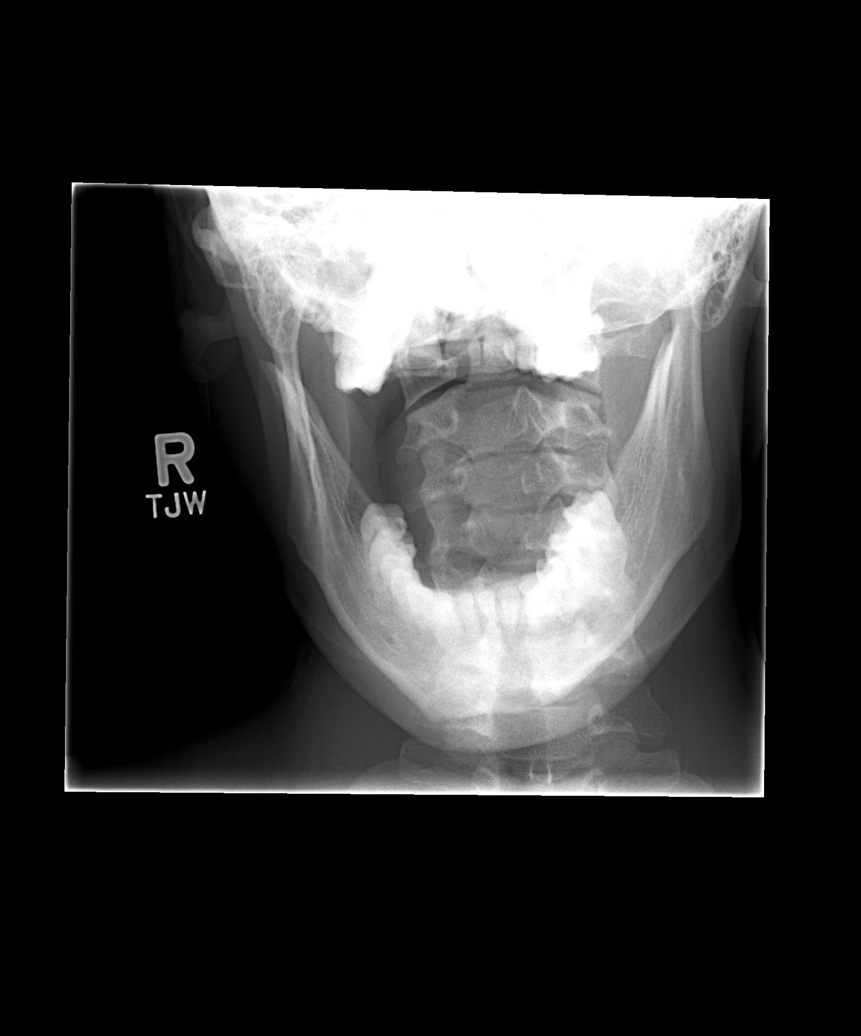

[view not recorded (5 of 5)]
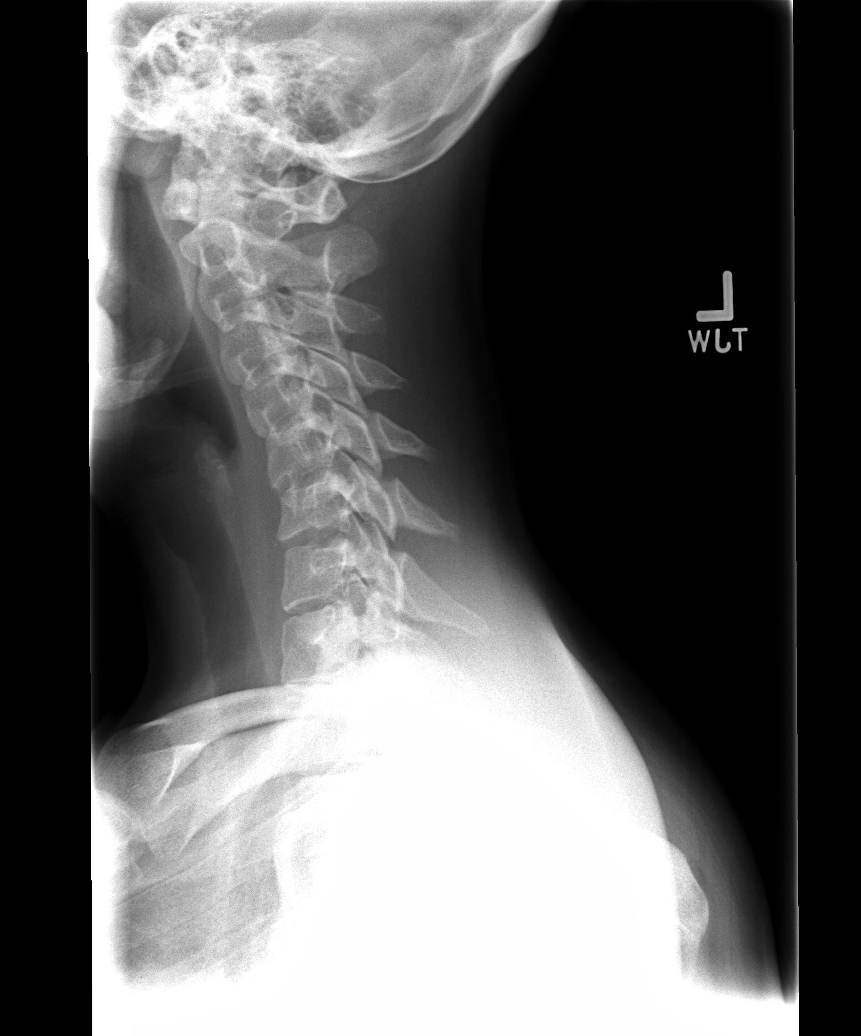

[5 of 5 positions shown; findings below may reference images not displayed]

FINDINGS: The lateral view images through bottom of T1. Prevertebral soft
tissues are within normal limits. Mild convex left curvature
throughout the cervical spine. Straightening and mild reversal of
expected lordosis. Maintenance of vertebral body height. Open-mouth
view suboptimal. Facets are well-aligned.
IMPRESSION: Spinal curvature and nonspecific straightening of expected lordosis.
This could be positional or due to muscular spasm. No acute osseous
abnormality.

## 2015-07-21 ENCOUNTER — Encounter: Payer: Self-pay | Admitting: Family Medicine

## 2015-07-21 ENCOUNTER — Other Ambulatory Visit (HOSPITAL_COMMUNITY)
Admission: RE | Admit: 2015-07-21 | Discharge: 2015-07-21 | Disposition: A | Discharge status: Home / Self Care | Payer: PRIVATE HEALTH INSURANCE | Source: Ambulatory Visit | Attending: Family Medicine | Admitting: Family Medicine

## 2015-07-21 ENCOUNTER — Ambulatory Visit (INDEPENDENT_AMBULATORY_CARE_PROVIDER_SITE_OTHER): Payer: PRIVATE HEALTH INSURANCE | Admitting: Family Medicine

## 2015-07-21 VITALS — BP 102/70 | HR 69 | Temp 98.6°F | Ht 66.5 in | Wt 160.5 lb

## 2015-07-21 DIAGNOSIS — Z23 Encounter for immunization: Secondary | ICD-10-CM | POA: Diagnosis not present

## 2015-07-21 DIAGNOSIS — Z1151 Encounter for screening for human papillomavirus (HPV): Secondary | ICD-10-CM | POA: Diagnosis present

## 2015-07-21 DIAGNOSIS — Z113 Encounter for screening for infections with a predominantly sexual mode of transmission: Secondary | ICD-10-CM | POA: Insufficient documentation

## 2015-07-21 DIAGNOSIS — Z0000 Encounter for general adult medical examination without abnormal findings: Secondary | ICD-10-CM

## 2015-07-21 DIAGNOSIS — Z124 Encounter for screening for malignant neoplasm of cervix: Secondary | ICD-10-CM

## 2015-07-21 DIAGNOSIS — Z13220 Encounter for screening for lipoid disorders: Secondary | ICD-10-CM

## 2015-07-21 DIAGNOSIS — Z01419 Encounter for gynecological examination (general) (routine) without abnormal findings: Secondary | ICD-10-CM | POA: Insufficient documentation

## 2015-07-21 DIAGNOSIS — Z131 Encounter for screening for diabetes mellitus: Secondary | ICD-10-CM | POA: Diagnosis not present

## 2015-07-21 LAB — LIPID PANEL
Cholesterol: 178 mg/dL (ref 0–200)
HDL: 45.1 mg/dL (ref >39.00)
LDL Cholesterol: 117 mg/dL — ABNORMAL HIGH (ref 0–99)
NonHDL: 132.89
Total CHOL/HDL Ratio: 4
Triglycerides: 79 mg/dL (ref 0.0–149.0)
VLDL: 15.8 mg/dL (ref 0.0–40.0)

## 2015-07-21 LAB — BASIC METABOLIC PANEL
BUN: 8 mg/dL (ref 6–23)
CO2: 30 mEq/L (ref 19–32)
Calcium: 9.6 mg/dL (ref 8.4–10.5)
Chloride: 103 mEq/L (ref 96–112)
Creatinine, Ser: 0.7 mg/dL (ref 0.40–1.20)
GFR: 109.81 mL/min (ref >60.00)
Glucose, Bld: 91 mg/dL (ref 70–99)
Potassium: 4.2 mEq/L (ref 3.5–5.1)
Sodium: 140 mEq/L (ref 135–145)

## 2015-07-21 NOTE — Progress Notes (Signed)
_0  Subjective:   Laura Gamble is a 23 y.o. pleasant patient who presents with the following:  Health Maintenance Summary Reviewed and updated, unless pt declines services.  Tobacco History Reviewed. Non-smoker Alcohol: No concerns, no excessive use Exercise Habits: working out a lot, limited some with work. rec at least 30 mins 5 times a week STD concerns: none Drug Use: None Birth control method: Condoms only Menses regular: yes Lumps or breast concerns: no Breast Cancer Family History: no  Health Maintenance  Topic Date Due  . TETANUS/TDAP  02/18/2011  . PAP SMEAR  03/05/2016  . INFLUENZA VACCINE  05/08/2016  . HIV Screening  Completed     Immunization History  Administered Date(s) Administered  . H1N1 08/13/2008  . HPV 9-valent 07/21/2015  . Hepatitis B 07/31/2004, 09/04/2004, 01/31/2005  . Hpv 05/23/2010, 08/01/2010  . Influenza Split 10/24/2012  . Influenza-Unspecified 07/11/2015  . PPD Test 03/05/2012  . Td 06/28/2009   Patient Active Problem List   Diagnosis Date Noted  . Common migraine 08/04/2013  . Major depression, recurrent ([place]) 07/01/2013  . Generalized anxiety disorder 04/28/2013  . Headache(784.0) 09/25/2012  . Panic attack 12/10/2011   Past Medical History  Diagnosis Date  . Panic attack   . Generalized anxiety disorder 04/28/2013  . Depression 07/01/2013   No past surgical history on file. Social History   Social History  . Marital Status: Single    Spouse Name: N/A  . Number of Children: N/A  . Years of Education: N/A   Occupational History  . Not on file.   Social History Main Topics  . Smoking status: Never Smoker   . Smokeless tobacco: Never Used  . Alcohol Use: No  . Drug Use: No  . Sexual Activity: Not on file   Other Topics Concern  . Not on file   Social History Narrative   student   Family History  Problem Relation Age of Onset  . Anxiety disorder Mother    No Known Allergies  Medication list has been  reviewed and updated.   General: Denies fever, chills, sweats. No significant weight loss. Eyes: Denies blurring,significant itching ENT: Denies earache, sore throat, and hoarseness.  Cardiovascular: Denies chest pains, palpitations, dyspnea on exertion,  Respiratory: Denies cough, dyspnea at rest,wheeezing Breast: no concerns about lumps GI: Denies nausea, vomiting, diarrhea, constipation, change in bowel habits, abdominal pain, melena, hematochezia GU: Denies dysuria, hematuria, urinary hesitancy, nocturia, denies STD risk, no concerns about discharge Musculoskeletal: Denies back pain, joint pain Derm: Denies rash, itching Neuro: Denies  paresthesias, frequent falls, frequent headaches Psych: Denies depression, anxiety  ANXIETY IS MUCH BETTER ON PROZAC Endocrine: Denies cold intolerance, heat intolerance, polydipsia Heme: Denies enlarged lymph nodes Allergy: No hayfever  Objective:   BP 102/70 mmHg  Pulse 69  Temp(Src) 98.6 F (37 C) (Oral)  Ht 5' 6.5" (1.689 m)  Wt 160 lb 8 oz (72.802 kg)  BMI 25.52 kg/m2  LMP 07/01/2015 No exam data present  GEN: well developed, well nourished, no acute distress Eyes: conjunctiva and lids normal, PERRLA, EOMI ENT: TM clear, nares clear, oral exam WNL Neck: supple, no lymphadenopathy, no thyromegaly, no JVD Pulm: clear to auscultation and percussion, respiratory effort normal CV: regular rate and rhythm, S1-S2, no murmur, rub or gallop, no bruits Chest: no scars, masses, no lumps BREAST: no lumps, no axillary LAD, no nipple discharge GI: soft, non-tender; no hepatosplenomegaly, masses; active bowel sounds all quadrants GU: Normal external female genitalia. Cervix appears intact without lesions  or irritation. Vaginal canal normal without ulceration or lesion. Cervix NT to exam. Ovaries neither enlarged nor tender. (Chaperoned examination by female staff) Lymph: no cervical, axillary or inguinal adenopathy MSK: gait normal, muscle tone and  strength WNL, no joint swelling, effusions, discoloration, crepitus  SKIN: clear, good turgor, color WNL, no rashes, lesions, or ulcerations Neuro: normal mental status, normal strength, sensation, and motion Psych: alert; oriented to person, place and time, normally interactive and not anxious or depressed in appearance.  All labs reviewed with patient. Lipids:    Component Value Date/Time   CHOL 178 07/21/2015 0942   TRIG 79.0 07/21/2015 0942   HDL 45.10 07/21/2015 0942   VLDL 15.8 07/21/2015 0942   CHOLHDL 4 07/21/2015 0942   CBC: CBC Latest Ref Rng 06/07/2014 08/31/2013 06/26/2013  WBC 4.0 - 10.5 K/uL 12.6(H) 4.9 5.3  Hemoglobin 12.0 - 15.0 g/dL 13.5 12.7 12.7  Hematocrit 36.0 - 46.0 % 38.8 37.4 37.4  Platelets 150 - 400 K/uL 290 277.0 606.3    Basic Metabolic Panel:    Component Value Date/Time   NA 140 07/21/2015 0942   K 4.2 07/21/2015 0942   CL 103 07/21/2015 0942   CO2 30 07/21/2015 0942   BUN 8 07/21/2015 0942   CREATININE 0.70 07/21/2015 0942   GLUCOSE 91 07/21/2015 0942   CALCIUM 9.6 07/21/2015 0942   Hepatic Function Latest Ref Rng 06/07/2014 08/31/2013  Total Protein 6.0 - 8.3 g/dL 8.5(H) 7.5  Albumin 3.5 - 5.2 g/dL 4.9 4.2  AST 0 - 37 U/L 22 23  ALT 0 - 35 U/L 12 20  Alk Phosphatase 39 - 117 U/L 61 49  Total Bilirubin 0.3 - 1.2 mg/dL 0.7 0.8  Bilirubin, Direct 0.0 - 0.3 mg/dL - 0.0    Lab Results  Component Value Date   TSH 0.50 08/31/2013   No results found.  Assessment and Plan:   Healthcare maintenance  Screening, lipid - Plan: Lipid panel  Screening for diabetes mellitus - Plan: Basic metabolic panel  Need for HPV vaccination - Plan: HPV 9-valent vaccine,Recombinat (Gardasil 9)  Cervical cancer screening - Plan: Cytology - PAP [place]  Screen for STD (sexually transmitted disease) - Plan: Cytology - PAP [place]  Health Maintenance Exam: The patient's preventative maintenance and recommended screening tests for an annual wellness  exam were reviewed in full today. Brought up to date unless services declined.  Counselled on the importance of diet, exercise, and its role in overall health and mortality. The patient's FH and SH was reviewed, including their home life, tobacco status, and drug and alcohol status.  Overall, doing great.   Follow-up: No Follow-up on file. Or follow-up in 1 year at [facility].  New Prescriptions   No medications on file   Modified Medications   No medications on file   Orders Placed This Encounter  Procedures  . HPV 9-valent vaccine,Recombinat (Gardasil 9)  . Basic metabolic panel  . Lipid panel    Signed,  Frederico Hamman T. Colter Magowan, MD   Patient's Medications  New Prescriptions   No medications on file  Previous Medications   FLUOXETINE (PROZAC) 20 MG CAPSULE    TAKE ONE CAPSULE BY MOUTH DAILY   LORAZEPAM (ATIVAN) 0.5 MG TABLET    Take 1 tablet (0.5 mg total) by mouth every 8 (eight) hours as needed for anxiety.  Modified Medications   No medications on file  Discontinued Medications   No medications on file

## 2015-07-21 NOTE — Progress Notes (Signed)
Pre visit review using our clinic review tool, if applicable. No additional management support is needed unless otherwise documented below in the visit note. 

## 2015-07-22 LAB — CYTOLOGY - PAP

## 2015-08-10 ENCOUNTER — Telehealth: Payer: Self-pay | Admitting: Family Medicine

## 2015-08-10 NOTE — Telephone Encounter (Signed)
[facility]    --------------------------------------------------------------------------------   Patient Name: Laura Gamble  Gender: Female  DOB: 1991/08/31   Age: 24 Y 77 M 20 D  Return Phone Number: [phone] (Primary)  Address: [place]    City/State/Zip: [place]  [address]   Client [facility] - Client  Client Site [facility]  Physician [place], [place]   Contact Type Call  Call Type Triage / Clinical  Relationship To Patient Self  Appointment Disposition EMR Appointment Not Necessary  Return Phone Number [phone] (Primary)  Chief Complaint Pregnancy question (general)  Initial Comment Took pregnancy tests yesterday that came back positive. Taking high dose of Prozac and has questions.        Nurse Assessment  Nurse: Amalia Hailey, RN, Melissa Date/Time (Eastern Time): 08/10/2015 4:20:50 PM  Confirm and document reason for call. If symptomatic, describe symptoms. ---Took pregnancy tests yesterday that came back positive. Taking high dose of Prozac (20 mg) and has questions.    Has the patient traveled out of the country within the last 30 days? ---Not Applicable    Does the patient have any new or worsening symptoms? ---No    Please document clinical information provided and list any resource used. ---Caller asking about taking Prozac in pregnancy, has been taking it for awhile and has concern if it would be harmful to the fetus, if she should stop the medication until she discusses this with her physician. Caller advised she should not suddenly and abruptly stop the medication until she talks with her doctor about if this is necessary and if necessary, the process to do so gradually weaning off the medication would be indicated , Caller also reminded that she has already been taking the medication so a few days would  not change her circumstances that the fetus has already been exposed. Caller advised if the medication is stopped abruptly she would have withdrawal which is not what she would want. Caller advised also consulting the website :https://www.drugs.com/pregnancy/fluoxetine.html the The evidence/information does not give conclusive evidence it would be harmful to a fetus : therefore, she should discuss this with her physician. Caller verb. understood. Caller given advise to write down her questions so she would have this available when she has her appt with her physician on Monday. Caller verb. understood information given.           Guidelines          Guideline Title Affirmed Question Affirmed Notes Nurse Date/Time (Eastern Time)       Disp. Time Eilene Ghazi Time) Disposition Final User    08/10/2015 3:47:46 PM Send To Clinical Follow Up Alric Quan, RN, Pat    08/10/2015 3:57:26 PM Send To Clinical Follow Up Valinda Party, RN, Solmon Ice    05/08/4480 8:56:31 PM Attempt made - message left   Amalia Hailey, RN, Lenna Sciara                   After Care Instructions Given        Call Event Type User Date / Time Description        --------------------------------------------------------------------------------         Comments  User: Colin Ina, RN Date/Time Eilene Ghazi Time): 08/10/2015 4:30:55 PM  Given advise/information also based on nursing knowledge and experience.

## 2015-08-11 NOTE — Telephone Encounter (Signed)
Left message for Laura Gamble to return my call

## 2015-08-11 NOTE — Telephone Encounter (Signed)
Dorenda notified as instructed by telephone. 

## 2015-08-11 NOTE — Telephone Encounter (Signed)
I tried to call, but the *67 function blocked on her phone.   Please let Laura Gamble know it is safe to go ahead and stop her Prozac now. It has a very, very long half-life, so of all the antidepressants and anxiety meds, it is the easiest to stop.   If she is upset or needs to talk to me, let me know, but I am going out of town for a family wedding all weekend.

## 2015-08-11 NOTE — Telephone Encounter (Signed)
i will call this morning.

## 2015-08-15 ENCOUNTER — Ambulatory Visit (INDEPENDENT_AMBULATORY_CARE_PROVIDER_SITE_OTHER): Payer: PRIVATE HEALTH INSURANCE | Admitting: Family Medicine

## 2015-08-15 ENCOUNTER — Encounter: Payer: Self-pay | Admitting: Family Medicine

## 2015-08-15 VITALS — BP 120/52 | HR 72 | Temp 98.7°F | Ht 66.5 in | Wt 159.5 lb

## 2015-08-15 DIAGNOSIS — N912 Amenorrhea, unspecified: Secondary | ICD-10-CM | POA: Diagnosis not present

## 2015-08-15 DIAGNOSIS — Z3490 Encounter for supervision of normal pregnancy, unspecified, unspecified trimester: Secondary | ICD-10-CM

## 2015-08-15 DIAGNOSIS — F410 Panic disorder [episodic paroxysmal anxiety] without agoraphobia: Secondary | ICD-10-CM

## 2015-08-15 DIAGNOSIS — Z3201 Encounter for pregnancy test, result positive: Secondary | ICD-10-CM | POA: Diagnosis not present

## 2015-08-15 LAB — POCT URINE PREGNANCY: Preg Test, Ur: POSITIVE — AB

## 2015-08-15 NOTE — Progress Notes (Signed)
Dr. Frederico Hamman T. Jahmad Petrich, MD, [facility] [place], [address] Phone: [phone] Fax: [phone]  08/15/2015  Patient: Laura Gamble, MRN: [id number], DOB: May 31, 1991, 24 y.o.  Primary Physician:  Owens Loffler, MD   Chief Complaint  Patient presents with  . Confirm Pregnancy   Subjective:   Laura Gamble is a 24 y.o. very pleasant female patient who presents with the following:  Pregnancy + See below  Anxiety / depression.  Told sister and boyfriend.  Boyfriend shocked and excited  LMP: 07/04/2015  EDC: 04/09/2016  Past Medical History, Surgical History, Social History, Family History, Problem List, Medications, and Allergies have been reviewed and updated if relevant.  Patient Active Problem List   Diagnosis Date Noted  . Common migraine 08/04/2013  . Major depression, recurrent ([place]) 07/01/2013  . Generalized anxiety disorder 04/28/2013  . Headache(784.0) 09/25/2012  . Panic attack 12/10/2011    Past Medical History  Diagnosis Date  . Panic attack   . Generalized anxiety disorder 04/28/2013  . Depression 07/01/2013    No past surgical history on file.  Social History   Social History  . Marital Status: Single    Spouse Name: N/A  . Number of Children: N/A  . Years of Education: N/A   Occupational History  . Not on file.   Social History Main Topics  . Smoking status: Never Smoker   . Smokeless tobacco: Never Used  . Alcohol Use: No  . Drug Use: No  . Sexual Activity: Not on file   Other Topics Concern  . Not on file   Social History Narrative   student    Family History  Problem Relation Age of Onset  . Anxiety disorder Mother     No Known Allergies  Medication list reviewed and updated in full in [facility].   GEN: No acute illnesses, no fevers, chills. GI: No n/v/d, eating normally Pulm: No SOB Interactive and getting along well at home.  Otherwise, ROS is  as per the HPI.  Objective:   BP 120/52 mmHg  Pulse 72  Temp(Src) 98.7 F (37.1 C) (Oral)  Ht 5' 6.5" (1.689 m)  Wt 159 lb 8 oz (72.349 kg)  BMI 25.36 kg/m2  LMP 07/01/2015  GEN: WDWN, NAD, Non-toxic, A & O x 3 HEENT: Atraumatic, Normocephalic. Neck supple. No masses, No LAD. Ears and Nose: No external deformity. CV: RRR, No M/G/R. No JVD. No thrill. No extra heart sounds. PULM: CTA B, no wheezes, crackles, rhonchi. No retractions. No resp. distress. No accessory muscle use. EXTR: No c/c/e NEURO Normal gait.  PSYCH: Normally interactive. Conversant. Not depressed or anxious appearing.  Calm demeanor.   Laboratory and Imaging Data:  Assessment and Plan:   Intrauterine pregnancy - Plan: Ambulatory referral to Obstetrics / Gynecology  Amenorrhea - Plan: POCT urine pregnancy  Panic attack  >15 minutes spent in face to face time with patient, >50% spent in counselling or coordination of care: confirmed intrauterine pregnancy.  2 home test positive and one test positive here in the office.  She is somewhat concerned given her ongoing chronic issues with anxiety and some depression.  This is notable, but I previously asked her to stop her Prozac.  She does feel anxious.  She has been unable to tell her parents, but she did talk about this with her boyfriend and her sister who is her twin.  I counseled her to the best of  my ability.  We are going to get her set up with obstetrics, and she is already on prenatal vitamins.  She knows not to drink alcohol or do other things would be harmful to the fetus.  Follow-up: No Follow-up on file.  Orders Placed This Encounter  Procedures  . Ambulatory referral to Obstetrics / Gynecology  . POCT urine pregnancy    Signed,  Brigetta Beckstrom T. Caitlyne Ingham, MD   Patient's Medications  New Prescriptions   No medications on file  Previous Medications   FLUOXETINE (PROZAC) 20 MG CAPSULE    TAKE ONE CAPSULE BY MOUTH DAILY   LORAZEPAM (ATIVAN) 0.5 MG  TABLET    Take 1 tablet (0.5 mg total) by mouth every 8 (eight) hours as needed for anxiety.  Modified Medications   No medications on file  Discontinued Medications   No medications on file

## 2015-08-15 NOTE — Patient Instructions (Signed)
Baby due date: 04/09/2016

## 2015-08-15 NOTE — Progress Notes (Signed)
Pre visit review using our clinic review tool, if applicable. No additional management support is needed unless otherwise documented below in the visit note. 

## 2015-08-29 ENCOUNTER — Inpatient Hospital Stay (HOSPITAL_COMMUNITY)
Admission: AD | Admit: 2015-08-29 | Discharge: 2015-08-29 | Disposition: A | Discharge status: Home / Self Care | Payer: PRIVATE HEALTH INSURANCE | Source: Ambulatory Visit | Attending: Internal Medicine | Admitting: Internal Medicine

## 2015-08-29 ENCOUNTER — Inpatient Hospital Stay (HOSPITAL_COMMUNITY): Payer: PRIVATE HEALTH INSURANCE

## 2015-08-29 ENCOUNTER — Encounter (HOSPITAL_COMMUNITY): Payer: Self-pay | Admitting: *Deleted

## 2015-08-29 DIAGNOSIS — O468X1 Other antepartum hemorrhage, first trimester: Secondary | ICD-10-CM

## 2015-08-29 DIAGNOSIS — O208 Other hemorrhage in early pregnancy: Secondary | ICD-10-CM | POA: Diagnosis not present

## 2015-08-29 DIAGNOSIS — O209 Hemorrhage in early pregnancy, unspecified: Secondary | ICD-10-CM

## 2015-08-29 DIAGNOSIS — Z3A08 8 weeks gestation of pregnancy: Secondary | ICD-10-CM | POA: Insufficient documentation

## 2015-08-29 DIAGNOSIS — O4691 Antepartum hemorrhage, unspecified, first trimester: Secondary | ICD-10-CM

## 2015-08-29 DIAGNOSIS — O418X10 Other specified disorders of amniotic fluid and membranes, first trimester, not applicable or unspecified: Secondary | ICD-10-CM

## 2015-08-29 DIAGNOSIS — Z3491 Encounter for supervision of normal pregnancy, unspecified, first trimester: Secondary | ICD-10-CM

## 2015-08-29 LAB — URINALYSIS, ROUTINE W REFLEX MICROSCOPIC
Bilirubin Urine: NEGATIVE
Glucose, UA: NEGATIVE mg/dL
Ketones, ur: NEGATIVE mg/dL
Nitrite: NEGATIVE
Protein, ur: NEGATIVE mg/dL
Specific Gravity, Urine: 1.02 (ref 1.005–1.030)
pH: 6.5 (ref 5.0–8.0)

## 2015-08-29 LAB — WET PREP, GENITAL
Sperm: NONE SEEN
Trich, Wet Prep: NONE SEEN
Yeast Wet Prep HPF POC: NONE SEEN

## 2015-08-29 LAB — CBC
HCT: 36 % (ref 36.0–46.0)
Hemoglobin: 12.5 g/dL (ref 12.0–15.0)
MCH: 29.6 pg (ref 26.0–34.0)
MCHC: 34.7 g/dL (ref 30.0–36.0)
MCV: 85.1 fL (ref 78.0–100.0)
Platelets: 257 10*3/uL (ref 150–400)
RBC: 4.23 MIL/uL (ref 3.87–5.11)
RDW: 12.8 % (ref 11.5–15.5)
WBC: 9.4 10*3/uL (ref 4.0–10.5)

## 2015-08-29 LAB — URINE MICROSCOPIC-ADD ON

## 2015-08-29 LAB — ABO/RH: ABO/RH(D): O POS

## 2015-08-29 LAB — HCG, QUANTITATIVE, PREGNANCY: hCG, Beta Chain, Quant, S: 91982 m[IU]/mL — ABNORMAL HIGH (ref <5)

## 2015-08-29 NOTE — MAU Note (Signed)
Pt presents to MAU with complaints of vaginal bleeding dark red in color. She noticed the bleeding while she was working today. Denies any pain at this time

## 2015-08-29 NOTE — Discharge Instructions (Signed)
First Trimester of Pregnancy The first trimester of pregnancy is from week 1 until the end of week 12 (months 1 through 3). A week after a sperm fertilizes an egg, the egg will implant on the wall of the uterus. This embryo will begin to develop into a baby. Genes from you and your partner are forming the baby. The female genes determine whether the baby is a boy or a girl. At 6-8 weeks, the eyes and face are formed, and the heartbeat can be seen on ultrasound. At the end of 12 weeks, all the baby's organs are formed.  Now that you are pregnant, you will want to do everything you can to have a healthy baby. Two of the most important things are to get good prenatal care and to follow your health care provider's instructions. Prenatal care is all the medical care you receive before the baby's birth. This care will help prevent, find, and treat any problems during the pregnancy and childbirth. BODY CHANGES Your body goes through many changes during pregnancy. The changes vary from woman to woman.   You may gain or lose a couple of pounds at first.  You may feel sick to your stomach (nauseous) and throw up (vomit). If the vomiting is uncontrollable, call your health care provider.  You may tire easily.  You may develop headaches that can be relieved by medicines approved by your health care provider.  You may urinate more often. Painful urination may mean you have a bladder infection.  You may develop heartburn as a result of your pregnancy.  You may develop constipation because certain hormones are causing the muscles that push waste through your intestines to slow down.  You may develop hemorrhoids or swollen, bulging veins (varicose veins).  Your breasts may begin to grow larger and become tender. Your nipples may stick out more, and the tissue that surrounds them (areola) may become darker.  Your gums may bleed and may be sensitive to brushing and flossing.  Dark spots or blotches (chloasma,  mask of pregnancy) may develop on your face. This will likely fade after the baby is born.  Your menstrual periods will stop.  You may have a loss of appetite.  You may develop cravings for certain kinds of food.  You may have changes in your emotions from day to day, such as being excited to be pregnant or being concerned that something may go wrong with the pregnancy and baby.  You may have more vivid and strange dreams.  You may have changes in your hair. These can include thickening of your hair, rapid growth, and changes in texture. Some women also have hair loss during or after pregnancy, or hair that feels dry or thin. Your hair will most likely return to normal after your baby is born. WHAT TO EXPECT AT YOUR PRENATAL VISITS During a routine prenatal visit:  You will be weighed to make sure you and the baby are growing normally.  Your blood pressure will be taken.  Your abdomen will be measured to track your baby's growth.  The fetal heartbeat will be listened to starting around week 10 or 12 of your pregnancy.  Test results from any previous visits will be discussed. Your health care provider may ask you:  How you are feeling.  If you are feeling the baby move.  If you have had any abnormal symptoms, such as leaking fluid, bleeding, severe headaches, or abdominal cramping.  If you are using any tobacco products,   including cigarettes, chewing tobacco, and electronic cigarettes.  If you have any questions. Other tests that may be performed during your first trimester include:  Blood tests to find your blood type and to check for the presence of any previous infections. They will also be used to check for low iron levels (anemia) and Rh antibodies. Later in the pregnancy, blood tests for diabetes will be done along with other tests if problems develop.  Urine tests to check for infections, diabetes, or protein in the urine.  An ultrasound to confirm the proper growth  and development of the baby.  An amniocentesis to check for possible genetic problems.  Fetal screens for spina bifida and Down syndrome.  You may need other tests to make sure you and the baby are doing well.  HIV (human immunodeficiency virus) testing. Routine prenatal testing includes screening for HIV, unless you choose not to have this test. HOME CARE INSTRUCTIONS  Medicines  Follow your health care provider's instructions regarding medicine use. Specific medicines may be either safe or unsafe to take during pregnancy.  Take your prenatal vitamins as directed.  If you develop constipation, try taking a stool softener if your health care provider approves. Diet  Eat regular, well-balanced meals. Choose a variety of foods, such as meat or vegetable-based protein, fish, milk and low-fat dairy products, vegetables, fruits, and whole grain breads and cereals. Your health care provider will help you determine the amount of weight gain that is right for you.  Avoid raw meat and uncooked cheese. These carry germs that can cause birth defects in the baby.  Eating four or five small meals rather than three large meals a day may help relieve nausea and vomiting. If you start to feel nauseous, eating a few soda crackers can be helpful. Drinking liquids between meals instead of during meals also seems to help nausea and vomiting.  If you develop constipation, eat more high-fiber foods, such as fresh vegetables or fruit and whole grains. Drink enough fluids to keep your urine clear or pale yellow. Activity and Exercise  Exercise only as directed by your health care provider. Exercising will help you:  Control your weight.  Stay in shape.  Be prepared for labor and delivery.  Experiencing pain or cramping in the lower abdomen or low back is a good sign that you should stop exercising. Check with your health care provider before continuing normal exercises.  Try to avoid standing for long  periods of time. Move your legs often if you must stand in one place for a long time.  Avoid heavy lifting.  Wear low-heeled shoes, and practice good posture.  You may continue to have sex unless your health care provider directs you otherwise. Relief of Pain or Discomfort  Wear a good support bra for breast tenderness.   Take warm sitz baths to soothe any pain or discomfort caused by hemorrhoids. Use hemorrhoid cream if your health care provider approves.   Rest with your legs elevated if you have leg cramps or low back pain.  If you develop varicose veins in your legs, wear support hose. Elevate your feet for 15 minutes, 3-4 times a day. Limit salt in your diet. Prenatal Care  Schedule your prenatal visits by the twelfth week of pregnancy. They are usually scheduled monthly at first, then more often in the last 2 months before delivery.  Write down your questions. Take them to your prenatal visits.  Keep all your prenatal visits as directed by your   health care provider. Safety  Wear your seat belt at all times when driving.  Make a list of emergency phone numbers, including numbers for family, friends, the hospital, and police and fire departments. General Tips  Ask your health care provider for a referral to a local prenatal education class. Begin classes no later than at the beginning of month 6 of your pregnancy.  Ask for help if you have counseling or nutritional needs during pregnancy. Your health care provider can offer advice or refer you to specialists for help with various needs.  Do not use hot tubs, steam rooms, or saunas.  Do not douche or use tampons or scented sanitary pads.  Do not cross your legs for long periods of time.  Avoid cat litter boxes and soil used by cats. These carry germs that can cause birth defects in the baby and possibly loss of the fetus by miscarriage or stillbirth.  Avoid all smoking, herbs, alcohol, and medicines not prescribed by  your health care provider. Chemicals in these affect the formation and growth of the baby.  Do not use any tobacco products, including cigarettes, chewing tobacco, and electronic cigarettes. If you need help quitting, ask your health care provider. You may receive counseling support and other resources to help you quit.  Schedule a dentist appointment. At home, brush your teeth with a soft toothbrush and be gentle when you floss. SEEK MEDICAL CARE IF:   You have dizziness.  You have mild pelvic cramps, pelvic pressure, or nagging pain in the abdominal area.  You have persistent nausea, vomiting, or diarrhea.  You have a bad smelling vaginal discharge.  You have pain with urination.  You notice increased swelling in your face, hands, legs, or ankles. SEEK IMMEDIATE MEDICAL CARE IF:   You have a fever.  You are leaking fluid from your vagina.  You have spotting or bleeding from your vagina.  You have severe abdominal cramping or pain.  You have rapid weight gain or loss.  You vomit blood or material that looks like coffee grounds.  You are exposed to German measles and have never had them.  You are exposed to fifth disease or chickenpox.  You develop a severe headache.  You have shortness of breath.  You have any kind of trauma, such as from a fall or a car accident.   This information is not intended to replace advice given to you by your health care provider. Make sure you discuss any questions you have with your health care provider.   Document Released: 09/18/2001 Document Revised: 10/15/2014 Document Reviewed: 08/04/2013 Elsevier Interactive Patient Education 2016 [employer]  

## 2015-08-29 NOTE — MAU Provider Note (Signed)
Chief Complaint: No chief complaint on file.   First Provider Initiated Contact with Patient 08/29/15 1318      SUBJECTIVE HPI: Laura Gamble is a 24 y.o. G1P0 at [redacted]w[redacted]d by LMP who presents to maternity admissions reporting onset of light red vaginal bleeding this morning, with positive pregnancy test. She reports intercourse last night prior to bleeding.   She denies vaginal itching/burning, urinary symptoms, h/a, dizziness, n/v, or fever/chills.     Vaginal Bleeding The patient's primary symptoms include vaginal bleeding. The patient's pertinent negatives include no pelvic pain or vaginal discharge. This is a new problem. The current episode started today. The problem occurs constantly. The problem has been unchanged. The pain is mild. She is pregnant. Associated symptoms include abdominal pain. Pertinent negatives include no chills, constipation, diarrhea, dysuria, fever, flank pain, frequency, headaches, nausea, urgency or vomiting. The vaginal bleeding is spotting. She has not been passing clots. She has not been passing tissue. Nothing aggravates the symptoms. She has tried nothing for the symptoms.    Past Medical History  Diagnosis Date  . Panic attack   . Generalized anxiety disorder 04/28/2013  . Depression 07/01/2013   History reviewed. No pertinent past surgical history. Social History   Social History  . Marital Status: Single    Spouse Name: N/A  . Number of Children: N/A  . Years of Education: N/A   Occupational History  . Not on file.   Social History Main Topics  . Smoking status: Never Smoker   . Smokeless tobacco: Never Used  . Alcohol Use: No  . Drug Use: No  . Sexual Activity: Yes    Birth Control/ Protection: None   Other Topics Concern  . Not on file   Social History Narrative   student   No current facility-administered medications on file prior to encounter.   No current outpatient prescriptions on file prior to encounter.   No Known  Allergies  ROS:  Review of Systems  Constitutional: Negative for fever, chills and fatigue.  HENT: Negative for sinus pressure.   Eyes: Negative for photophobia.  Respiratory: Negative for shortness of breath.   Cardiovascular: Negative for chest pain.  Gastrointestinal: Positive for abdominal pain. Negative for nausea, vomiting, diarrhea and constipation.  Genitourinary: Negative for dysuria, urgency, frequency, flank pain, vaginal bleeding, vaginal discharge, difficulty urinating, vaginal pain and pelvic pain.  Musculoskeletal: Negative for neck pain.  Neurological: Negative for dizziness, weakness and headaches.  Psychiatric/Behavioral: Negative.      I have reviewed patient's Past Medical Hx, Surgical Hx, Family Hx, Social Hx, medications and allergies.   Physical Exam  Patient Vitals for the past 24 hrs:  BP Temp Pulse Resp  08/29/15 1301 147/75 mmHg 98.3 F (36.8 C) 107 18   Constitutional: Well-developed, well-nourished female in no acute distress.  Cardiovascular: normal rate Respiratory: normal effort GI: Abd soft, non-tender. Pos BS x 4 MS: Extremities nontender, no edema, normal ROM Neurologic: Alert and oriented x 4.  GU: Neg CVAT.  PELVIC EXAM: Cervix pink, visually closed, without lesion, scant dark red bleeding, vaginal walls and external genitalia normal Bimanual exam: Cervix 0/long/high, firm, anterior, neg CMT, uterus nontender, ~9 week size, adnexa without tenderness, enlargement, or mass  LAB RESULTS  Results for orders placed or performed during the hospital encounter of 08/29/15 (from the past 72 hour(s))  Urinalysis, Routine w reflex microscopic (not at [facility])     Status: Abnormal   Collection Time: 08/29/15 12:40 PM  Result Value Ref Range  Color, Urine YELLOW YELLOW   APPearance CLEAR CLEAR   Specific Gravity, Urine 1.020 1.005 - 1.030   pH 6.5 5.0 - 8.0   Glucose, UA NEGATIVE NEGATIVE mg/dL   Hgb urine dipstick LARGE (A) NEGATIVE   Bilirubin  Urine NEGATIVE NEGATIVE   Ketones, ur NEGATIVE NEGATIVE mg/dL   Protein, ur NEGATIVE NEGATIVE mg/dL   Nitrite NEGATIVE NEGATIVE   Leukocytes, UA SMALL (A) NEGATIVE  Urine microscopic-add on     Status: Abnormal   Collection Time: 08/29/15 12:40 PM  Result Value Ref Range   Squamous Epithelial / LPF 6-30 (A) NONE SEEN    Comment: Please note change in reference range.   WBC, UA 6-30 0 - 5 WBC/hpf    Comment: Please note change in reference range.   RBC / HPF 0-5 0 - 5 RBC/hpf    Comment: Please note change in reference range.   Bacteria, UA MANY (A) NONE SEEN    Comment: Please note change in reference range.   Urine-Other LESS THAN 10 mL OF URINE SUBMITTED     Comment: MISCROSCOPIC PERFORMED ON UNCONCENTRATED URINE  Wet prep, genital     Status: Abnormal   Collection Time: 08/29/15  1:35 PM  Result Value Ref Range   Yeast Wet Prep HPF POC NONE SEEN NONE SEEN   Trich, Wet Prep NONE SEEN NONE SEEN   Clue Cells Wet Prep HPF POC PRESENT (A) NONE SEEN   WBC, Wet Prep HPF POC FEW (A) NONE SEEN    Comment: MODERATE BACTERIA SEEN   Sperm NONE SEEN   CBC     Status: None   Collection Time: 08/29/15  1:38 PM  Result Value Ref Range   WBC 9.4 4.0 - 10.5 K/uL   RBC 4.23 3.87 - 5.11 MIL/uL   Hemoglobin 12.5 12.0 - 15.0 g/dL   HCT 36.0 36.0 - 46.0 %   MCV 85.1 78.0 - 100.0 fL   MCH 29.6 26.0 - 34.0 pg   MCHC 34.7 30.0 - 36.0 g/dL   RDW 12.8 11.5 - 15.5 %   Platelets 257 150 - 400 K/uL  hCG, quantitative, pregnancy     Status: Abnormal   Collection Time: 08/29/15  1:38 PM  Result Value Ref Range   hCG, Beta Chain, Quant, S 91982 (H) <5 mIU/mL    Comment:          GEST. AGE      CONC.  (mIU/mL)   <=1 WEEK        5 - 50     2 WEEKS       50 - 500     3 WEEKS       100 - 10,000     4 WEEKS     1,000 - 30,000     5 WEEKS     3,500 - 115,000   6-8 WEEKS     12,000 - 270,000    12 WEEKS     15,000 - 220,000        FEMALE AND NON-PREGNANT FEMALE:     LESS THAN 5 mIU/mL   ABO/Rh      Status: None   Collection Time: 08/29/15  1:38 PM  Result Value Ref Range   ABO/RH(D) O POS        IMAGING US Ob Comp Less 14 Wks  08/29/2015  CLINICAL DATA:  Vaginal bleeding EXAM: OBSTETRIC <14 WK [place] AND TRANSVAGINAL OB US TECHNIQUE: Both transabdominal and transvaginal ultrasound examinations were  performed for complete evaluation of the gestation as well as the maternal uterus, adnexal regions, and pelvic cul-de-sac. Transvaginal technique was performed to assess early pregnancy. COMPARISON:  None. FINDINGS: Intrauterine gestational sac: Single Yolk sac:  Yes Embryo:  Yes Cardiac Activity: Yes Heart Rate: 137  bpm MSD:   mm    w     d CRL:  11.6  mm   7 w   2 d                  [place] EDC: 04/14/2016 Maternal uterus/adnexae: Subchorionic hemorrhage: Small Right ovary: Normal Left ovary: Normal Other :None Free fluid:  None IMPRESSION: 1. Single living intrauterine gestation. The gestational age is 7 weeks and 2 days. 2. Small subchorionic hemorrhage. Electronically Signed   By: Kerby Moors M.D.   On: 08/29/2015 14:34   US Ob Transvaginal  08/29/2015  CLINICAL DATA:  Vaginal bleeding EXAM: OBSTETRIC <14 WK [place] AND TRANSVAGINAL OB US TECHNIQUE: Both transabdominal and transvaginal ultrasound examinations were performed for complete evaluation of the gestation as well as the maternal uterus, adnexal regions, and pelvic cul-de-sac. Transvaginal technique was performed to assess early pregnancy. COMPARISON:  None. FINDINGS: Intrauterine gestational sac: Single Yolk sac:  Yes Embryo:  Yes Cardiac Activity: Yes Heart Rate: 137  bpm MSD:   mm    w     d CRL:  11.6  mm   7 w   2 d                  [place] EDC: 04/14/2016 Maternal uterus/adnexae: Subchorionic hemorrhage: Small Right ovary: Normal Left ovary: Normal Other :None Free fluid:  None IMPRESSION: 1. Single living intrauterine gestation. The gestational age is 7 weeks and 2 days. 2. Small subchorionic hemorrhage. Electronically Signed   By: Kerby Moors  M.D.   On: 08/29/2015 14:34    MAU Management/MDM: Ordered labs and reviewed results.  IUP with [facility] on [place].  Discussed findings with pt, reassurance provided. Pt stable at time of discharge.  ASSESSMENT  1. Normal IUP (intrauterine pregnancy) on prenatal ultrasound, first trimester   2. Vaginal bleeding in pregnancy, first trimester   3. Subchorionic hemorrhage in first trimester     PLAN Discharge home with bleeding precautions F/U with early prenatal care Return to MAU as needed for emergencies    Medication List    Notice    You have not been prescribed any medications.       Fatima Blank Certified Nurse-Midwife 08/29/2015  1:52 PM

## 2015-08-31 LAB — GC/CHLAMYDIA PROBE AMP (~~LOC~~) NOT AT ARMC
Chlamydia: NEGATIVE
Neisseria Gonorrhea: NEGATIVE

## 2015-09-13 LAB — OB RESULTS CONSOLE HIV ANTIBODY (ROUTINE TESTING): HIV: NONREACTIVE

## 2015-09-13 LAB — OB RESULTS CONSOLE RPR: RPR: NONREACTIVE

## 2015-09-13 LAB — OB RESULTS CONSOLE ABO/RH: RH Type: POSITIVE

## 2015-09-13 LAB — OB RESULTS CONSOLE RUBELLA ANTIBODY, IGM: Rubella: IMMUNE

## 2015-09-13 LAB — OB RESULTS CONSOLE HEPATITIS B SURFACE ANTIGEN: Hepatitis B Surface Ag: NEGATIVE

## 2015-09-13 LAB — OB RESULTS CONSOLE ANTIBODY SCREEN: Antibody Screen: NEGATIVE

## 2015-09-20 LAB — OB RESULTS CONSOLE GC/CHLAMYDIA
Chlamydia: NEGATIVE
Gonorrhea: NEGATIVE

## 2015-10-09 NOTE — L&D Delivery Note (Signed)
SVD of VFI at 1640 on 04/11/16.  EBL 250cc.  Placenta to L&D.  APGARs 8,9. Head delivered ROA with tight nuchal x 2 delivered through.  Baby to abdomen.  Cord was clamped and cut.  Cord blood was obtained.  Placenta delivered with pitocin and massage.  2nd degree perineal, right periurethral and periclitoral lacs were repaired with 3-0 rapide in the normal fashion.  Mom and baby stable.  Linda Hedges, DO

## 2015-12-15 ENCOUNTER — Inpatient Hospital Stay (HOSPITAL_COMMUNITY)
Admission: AD | Admit: 2015-12-15 | Payer: PRIVATE HEALTH INSURANCE | Source: Ambulatory Visit | Admitting: Obstetrics and Gynecology

## 2016-03-15 LAB — OB RESULTS CONSOLE GBS: GBS: NEGATIVE

## 2016-03-26 ENCOUNTER — Ambulatory Visit (INDEPENDENT_AMBULATORY_CARE_PROVIDER_SITE_OTHER)
Admission: RE | Admit: 2016-03-26 | Discharge: 2016-03-26 | Disposition: A | Discharge status: Home / Self Care | Payer: PRIVATE HEALTH INSURANCE | Source: Ambulatory Visit | Attending: Family Medicine | Admitting: Family Medicine

## 2016-03-26 ENCOUNTER — Encounter: Payer: Self-pay | Admitting: Family Medicine

## 2016-03-26 ENCOUNTER — Ambulatory Visit (INDEPENDENT_AMBULATORY_CARE_PROVIDER_SITE_OTHER): Payer: PRIVATE HEALTH INSURANCE | Admitting: Family Medicine

## 2016-03-26 VITALS — BP 120/60 | HR 81 | Temp 98.3°F | Ht 66.5 in | Wt 203.5 lb

## 2016-03-26 DIAGNOSIS — M25572 Pain in left ankle and joints of left foot: Secondary | ICD-10-CM

## 2016-03-26 DIAGNOSIS — S93412A Sprain of calcaneofibular ligament of left ankle, initial encounter: Secondary | ICD-10-CM

## 2016-03-26 DIAGNOSIS — S93492A Sprain of other ligament of left ankle, initial encounter: Secondary | ICD-10-CM

## 2016-03-26 DIAGNOSIS — Z3490 Encounter for supervision of normal pregnancy, unspecified, unspecified trimester: Secondary | ICD-10-CM

## 2016-03-26 NOTE — Progress Notes (Signed)
Dr. Frederico Hamman T. Khalik Pewitt, MD, [facility] [place], [address] Phone: [phone] Fax: [phone]  03/26/2016  Patient: Laura Gamble, MRN: [id number], DOB: 19-Oct-1990, 25 y.o.  Primary Physician:  Owens Loffler, MD   Chief Complaint  Patient presents with  . Ankle Pain    Left-Hurt walking yesterday   Subjective:   Laura Gamble is a 25 y.o. very pleasant female patient who presents with the following:  EDD, 04/06/2016  Husband Lurena Joiner.  No morning sickness.   9 weeks - her twin sister.   Having a daughter.  Ellie Sophia  Left lateral ankle pain. Walking on it right now. She has a left lateral ankle inversion injury yesterday. Date of injury 03/25/2016. Yesterday she was unable to walk on it, but after today she has been able to walk on it with some difficulty. She has had some considerable lateral swelling   Past Medical History, Surgical History, Social History, Family History, Problem List, Medications, and Allergies have been reviewed and updated if relevant.  Patient Active Problem List   Diagnosis Date Noted  . Common migraine 08/04/2013  . Major depression, recurrent ([place]) 07/01/2013  . Generalized anxiety disorder 04/28/2013  . Headache(784.0) 09/25/2012  . Panic attack 12/10/2011    Past Medical History  Diagnosis Date  . Panic attack   . Generalized anxiety disorder 04/28/2013  . Depression 07/01/2013    No past surgical history on file.  Social History   Social History  . Marital Status: Married    Spouse Name: N/A  . Number of Children: N/A  . Years of Education: N/A   Occupational History  . Not on file.   Social History Main Topics  . Smoking status: Never Smoker   . Smokeless tobacco: Never Used  . Alcohol Use: No  . Drug Use: No  . Sexual Activity: Yes    Birth Control/ Protection: None   Other Topics Concern  . Not on file   Social History Narrative   student    Family History  Problem Relation Age of Onset  . Anxiety disorder Mother     No Known Allergies  Medication list reviewed and updated in full in [facility].  GEN: No fevers, chills. Nontoxic. Primarily MSK c/o today. MSK: Detailed in the HPI GI: tolerating PO intake without difficulty Neuro: No numbness, parasthesias, or tingling associated. Otherwise the pertinent positives of the ROS are noted above.   Objective:   BP 120/60 mmHg  Pulse 81  Temp(Src) 98.3 F (36.8 C) (Oral)  Ht 5' 6.5" (1.689 m)  Wt 203 lb 8 oz (92.307 kg)  BMI 32.36 kg/m2  LMP 07/01/2015   GEN: Well-developed,well-nourished,in no acute distress; alert,appropriate and cooperative throughout examination HEENT: Normocephalic and atraumatic without obvious abnormalities. Ears, externally no deformities PULM: Breathing comfortably in no respiratory distress EXT: No clubbing, cyanosis, or edema PSYCH: Normally interactive. Cooperative during the interview. Pleasant. Friendly and conversant. Not anxious or depressed appearing. Normal, full affect.  ANKLE: L Echymosis: no Edema: notable laterally ROM: Full dorsi and plantar flexion, inversion, eversion - mild loss of motion Gait: heel toe, mildly antalgic Lateral Mall: TTP Medial Mall: NT Talus: NT Navicular: NT Cuboid: NT Calcaneous: NT Metatarsals: NT 5th MT: NT Phalanges: NT Achilles: NT Plantar Fascia: NT Fat Pad: NT Peroneals: NT Post Tib: NT Great Toe: Nml motion Ant Drawer: neg Talar Tilt: neg ATFL: TTP CFL: TTP Deltoid: NT Str: 5/5 Other  Special tests: none Sensation: intact   Radiology: Dg Ankle Complete Left  03/26/2016  CLINICAL DATA:  Patient miss-stepped yesterday and has persistent lateral ankle discomfort EXAM: LEFT ANKLE COMPLETE - 3+ VIEW COMPARISON:  None in PACs FINDINGS: The ankle joint mortise is preserved. The talar dome is intact. There is no acute malleolar fracture. The talus and calcaneus exhibit  no fracture. The visualized metatarsal bases are intact. There is diffuse soft tissue swelling. IMPRESSION: No acute bony abnormality of the left ankle. Mild diffuse soft tissue swelling. Electronically Signed   By: David  Martinique M.D.   On: 03/26/2016 16:53     Assessment and Plan:   Sprain of anterior talofibular ligament of left ankle, initial encounter  Left ankle pain - Plan: DG Ankle Complete Left  Calcaneofibular (ligament) ankle sprain, left, initial encounter  Intrauterine pregnancy  C/w ATFL and CFL sprain. Draped with lead for XR - normal  No NSAIDS ICE, elevation  Placed in an ASO ankle brace for additional support.  Okay to return to work. Given that she is 24 weeks and 3 days pregnant, if at any point she feels like she is having difficulty working combined with her ankle pain, think it would be reasonable for her to go out of work for 1-2 weeks.  Follow-up: No Follow-up on file.  Orders Placed This Encounter  Procedures  . DG Ankle Complete Left    Signed,  Kassia Demarinis T. Kalyani Maeda, MD   Patient's Medications  New Prescriptions   No medications on file  Previous Medications   PRENATAL VIT-FE FUMARATE-FA (PRENATAL MULTIVITAMIN) TABS TABLET    Take 2 tablets by mouth daily at 12 noon.  Modified Medications   No medications on file  Discontinued Medications   No medications on file

## 2016-03-26 NOTE — Progress Notes (Signed)
Pre visit review using our clinic review tool, if applicable. No additional management support is needed unless otherwise documented below in the visit note. 

## 2016-04-03 ENCOUNTER — Inpatient Hospital Stay (HOSPITAL_COMMUNITY)
Admission: AD | Admit: 2016-04-03 | Discharge: 2016-04-03 | Disposition: A | Discharge status: Home / Self Care | Payer: PRIVATE HEALTH INSURANCE | Source: Ambulatory Visit | Attending: Obstetrics and Gynecology | Admitting: Obstetrics and Gynecology

## 2016-04-03 ENCOUNTER — Encounter (HOSPITAL_COMMUNITY): Payer: Self-pay | Admitting: *Deleted

## 2016-04-03 DIAGNOSIS — N898 Other specified noninflammatory disorders of vagina: Secondary | ICD-10-CM | POA: Insufficient documentation

## 2016-04-03 DIAGNOSIS — O26893 Other specified pregnancy related conditions, third trimester: Secondary | ICD-10-CM | POA: Insufficient documentation

## 2016-04-03 DIAGNOSIS — Z3A39 39 weeks gestation of pregnancy: Secondary | ICD-10-CM | POA: Diagnosis not present

## 2016-04-03 LAB — POCT FERN TEST

## 2016-04-03 NOTE — Progress Notes (Signed)
25 yo @ 39 4/7 weeks C/O vaginal "dripping" all day. No gush and no bleeding. Some contractions. No HA or vision change. No epigastric pain. Pregnancy uncomplicated.  Filed Vitals:   04/03/16 2001 04/03/16 2035  BP: 141/87 133/79  Pulse: 98 76  Temp: 99 F (37.2 C) 98.9 F (37.2 C)  Resp: 18 20  Repeat BP 130/80  Lungs CTA Cor RRR Abd gravid, soft, no epigastric tenderness DTR 2+ without clonus SSE no pool, fern negative Cx closed/30/-2/vtx FHT cat one  A/P: patient reassured         D/C home with labor precautions

## 2016-04-03 NOTE — MAU Note (Signed)
Pt. Started having some nausea and diarrhea last night.  She has also been have leaking/dripping on and off all day and was advised to come in by her nurse.

## 2016-04-10 ENCOUNTER — Inpatient Hospital Stay (HOSPITAL_COMMUNITY)
Admission: AD | Admit: 2016-04-10 | Discharge: 2016-04-13 | DRG: 775 | Disposition: A | Discharge status: Home / Self Care | Payer: PRIVATE HEALTH INSURANCE | Source: Ambulatory Visit | Attending: Obstetrics & Gynecology | Admitting: Obstetrics & Gynecology

## 2016-04-10 ENCOUNTER — Encounter (HOSPITAL_COMMUNITY): Payer: Self-pay

## 2016-04-10 DIAGNOSIS — Z3A39 39 weeks gestation of pregnancy: Secondary | ICD-10-CM | POA: Diagnosis not present

## 2016-04-10 DIAGNOSIS — O99344 Other mental disorders complicating childbirth: Secondary | ICD-10-CM | POA: Diagnosis present

## 2016-04-10 DIAGNOSIS — O4202 Full-term premature rupture of membranes, onset of labor within 24 hours of rupture: Principal | ICD-10-CM | POA: Diagnosis present

## 2016-04-10 DIAGNOSIS — Z818 Family history of other mental and behavioral disorders: Secondary | ICD-10-CM

## 2016-04-10 DIAGNOSIS — F411 Generalized anxiety disorder: Secondary | ICD-10-CM | POA: Diagnosis present

## 2016-04-10 DIAGNOSIS — Z8249 Family history of ischemic heart disease and other diseases of the circulatory system: Secondary | ICD-10-CM

## 2016-04-10 DIAGNOSIS — Z3490 Encounter for supervision of normal pregnancy, unspecified, unspecified trimester: Secondary | ICD-10-CM

## 2016-04-10 LAB — POCT FERN TEST: POCT Fern Test: POSITIVE

## 2016-04-10 MED ORDER — LIDOCAINE HCL (PF) 1 % IJ SOLN
30.0000 mL | INTRAMUSCULAR | Status: DC | PRN
Start: 2016-04-10 — End: 2016-04-11
  Filled 2016-04-10: qty 30

## 2016-04-10 MED ORDER — LACTATED RINGERS IV SOLN
INTRAVENOUS | Status: DC
  Administered 2016-04-11 (×3): via INTRAVENOUS

## 2016-04-10 MED ORDER — OXYTOCIN BOLUS FROM INFUSION: 500.0000 mL | INTRAVENOUS | Status: DC

## 2016-04-10 MED ORDER — OXYCODONE-ACETAMINOPHEN 5-325 MG PO TABS: 2.0000 | ORAL_TABLET | ORAL | Status: DC | PRN

## 2016-04-10 MED ORDER — SOD CITRATE-CITRIC ACID 500-334 MG/5ML PO SOLN
30.0000 mL | ORAL | Status: DC | PRN
Start: 2016-04-10 — End: 2016-04-11

## 2016-04-10 MED ORDER — LACTATED RINGERS IV SOLN: 500.0000 mL | INTRAVENOUS | Status: DC | PRN

## 2016-04-10 MED ORDER — OXYCODONE-ACETAMINOPHEN 5-325 MG PO TABS: 1.0000 | ORAL_TABLET | ORAL | Status: DC | PRN

## 2016-04-10 MED ORDER — OXYTOCIN 40 UNITS IN LACTATED RINGERS INFUSION - SIMPLE MED
2.5000 [IU]/h | INTRAVENOUS | Status: DC
  Filled 2016-04-10: qty 1000

## 2016-04-10 MED ORDER — ACETAMINOPHEN 325 MG PO TABS: 650.0000 mg | ORAL_TABLET | ORAL | Status: DC | PRN

## 2016-04-10 MED ORDER — ONDANSETRON HCL 4 MG/2ML IJ SOLN
4.0000 mg | Freq: Four times a day (QID) | INTRAMUSCULAR | Status: DC | PRN
  Administered 2016-04-11: 4 mg via INTRAVENOUS
  Filled 2016-04-10: qty 2

## 2016-04-10 MED ORDER — FLEET ENEMA 7-19 GM/118ML RE ENEM: 1.0000 | ENEMA | RECTAL | Status: DC | PRN

## 2016-04-10 NOTE — MAU Note (Signed)
?   Water broke at 10:15 pm, clear. Contractions since 0430 am. Stronger as day went on.  Every 4 min now.  Closed at last appointment.  Baby moving well. No bleeding.

## 2016-04-11 ENCOUNTER — Inpatient Hospital Stay (HOSPITAL_COMMUNITY): Payer: PRIVATE HEALTH INSURANCE | Admitting: Anesthesiology

## 2016-04-11 ENCOUNTER — Encounter (HOSPITAL_COMMUNITY): Payer: Self-pay

## 2016-04-11 DIAGNOSIS — Z3490 Encounter for supervision of normal pregnancy, unspecified, unspecified trimester: Secondary | ICD-10-CM

## 2016-04-11 LAB — COMPREHENSIVE METABOLIC PANEL
ALT: 15 U/L (ref 14–54)
AST: 21 U/L (ref 15–41)
Albumin: 3.3 g/dL — ABNORMAL LOW (ref 3.5–5.0)
Alkaline Phosphatase: 131 U/L — ABNORMAL HIGH (ref 38–126)
Anion gap: 8 (ref 5–15)
BUN: 6 mg/dL (ref 6–20)
CO2: 21 mmol/L — ABNORMAL LOW (ref 22–32)
Calcium: 9.1 mg/dL (ref 8.9–10.3)
Chloride: 106 mmol/L (ref 101–111)
Creatinine, Ser: 0.49 mg/dL (ref 0.44–1.00)
GFR calc Af Amer: >60 mL/min (ref >60)
GFR calc non Af Amer: >60 mL/min (ref >60)
Glucose, Bld: 93 mg/dL (ref 65–99)
Potassium: 3.7 mmol/L (ref 3.5–5.1)
Sodium: 135 mmol/L (ref 135–145)
Total Bilirubin: 0.4 mg/dL (ref 0.3–1.2)
Total Protein: 6.7 g/dL (ref 6.5–8.1)

## 2016-04-11 LAB — TYPE AND SCREEN
ABO/RH(D): O POS
Antibody Screen: NEGATIVE

## 2016-04-11 LAB — CBC
HCT: 37.2 % (ref 36.0–46.0)
Hemoglobin: 13.1 g/dL (ref 12.0–15.0)
MCH: 30.7 pg (ref 26.0–34.0)
MCHC: 35.2 g/dL (ref 30.0–36.0)
MCV: 87.1 fL (ref 78.0–100.0)
Platelets: 271 10*3/uL (ref 150–400)
RBC: 4.27 MIL/uL (ref 3.87–5.11)
RDW: 13.7 % (ref 11.5–15.5)
WBC: 13.7 10*3/uL — ABNORMAL HIGH (ref 4.0–10.5)

## 2016-04-11 LAB — PROTEIN / CREATININE RATIO, URINE
Creatinine, Urine: 93 mg/dL
Protein Creatinine Ratio: 0.2 mg/mg{Cre} — ABNORMAL HIGH (ref 0.00–0.15)
Total Protein, Urine: 19 mg/dL

## 2016-04-11 LAB — URIC ACID: Uric Acid, Serum: 4 mg/dL (ref 2.3–6.6)

## 2016-04-11 MED ORDER — LACTATED RINGERS IV SOLN
500.0000 mL | Freq: Once | INTRAVENOUS | Status: AC
  Administered 2016-04-11: 500 mL via INTRAVENOUS

## 2016-04-11 MED ORDER — FENTANYL 2.5 MCG/ML BUPIVACAINE 1/10 % EPIDURAL INFUSION (WH - ANES)
INTRAMUSCULAR | Status: AC
  Filled 2016-04-11: qty 125

## 2016-04-11 MED ORDER — PHENYLEPHRINE 40 MCG/ML (10ML) SYRINGE FOR IV PUSH (FOR BLOOD PRESSURE SUPPORT)
PREFILLED_SYRINGE | INTRAVENOUS | Status: AC
  Filled 2016-04-11: qty 20

## 2016-04-11 MED ORDER — EPHEDRINE 5 MG/ML INJ
10.0000 mg | INTRAVENOUS | Status: DC | PRN
  Filled 2016-04-11: qty 2

## 2016-04-11 MED ORDER — ACETAMINOPHEN 325 MG PO TABS
650.0000 mg | ORAL_TABLET | ORAL | Status: DC | PRN
  Administered 2016-04-12 (×2): 650 mg via ORAL
  Filled 2016-04-11 (×2): qty 2

## 2016-04-11 MED ORDER — OXYCODONE-ACETAMINOPHEN 5-325 MG PO TABS: 2.0000 | ORAL_TABLET | ORAL | Status: DC | PRN

## 2016-04-11 MED ORDER — WITCH HAZEL-GLYCERIN EX PADS: 1.0000 "application " | MEDICATED_PAD | CUTANEOUS | Status: DC | PRN

## 2016-04-11 MED ORDER — ONDANSETRON HCL 4 MG PO TABS: 4.0000 mg | ORAL_TABLET | ORAL | Status: DC | PRN

## 2016-04-11 MED ORDER — FENTANYL 2.5 MCG/ML BUPIVACAINE 1/10 % EPIDURAL INFUSION (WH - ANES)
14.0000 mL/h | INTRAMUSCULAR | Status: DC | PRN
  Administered 2016-04-11 (×2): 14 mL/h via EPIDURAL
  Filled 2016-04-11: qty 125

## 2016-04-11 MED ORDER — TETANUS-DIPHTH-ACELL PERTUSSIS 5-2.5-18.5 LF-MCG/0.5 IM SUSP: 0.5000 mL | Freq: Once | INTRAMUSCULAR | Status: DC

## 2016-04-11 MED ORDER — PRENATAL MULTIVITAMIN CH
1.0000 | ORAL_TABLET | Freq: Every day | ORAL | Status: DC
  Administered 2016-04-12: 1 via ORAL
  Filled 2016-04-11: qty 1

## 2016-04-11 MED ORDER — DIPHENHYDRAMINE HCL 25 MG PO CAPS: 25.0000 mg | ORAL_CAPSULE | Freq: Four times a day (QID) | ORAL | Status: DC | PRN

## 2016-04-11 MED ORDER — ONDANSETRON HCL 4 MG/2ML IJ SOLN: 4.0000 mg | INTRAMUSCULAR | Status: DC | PRN

## 2016-04-11 MED ORDER — SENNOSIDES-DOCUSATE SODIUM 8.6-50 MG PO TABS
2.0000 | ORAL_TABLET | ORAL | Status: DC
  Administered 2016-04-11 – 2016-04-13 (×2): 2 via ORAL
  Filled 2016-04-11 (×2): qty 2

## 2016-04-11 MED ORDER — PHENYLEPHRINE 40 MCG/ML (10ML) SYRINGE FOR IV PUSH (FOR BLOOD PRESSURE SUPPORT)
80.0000 ug | PREFILLED_SYRINGE | INTRAVENOUS | Status: DC | PRN
  Filled 2016-04-11: qty 5

## 2016-04-11 MED ORDER — SIMETHICONE 80 MG PO CHEW: 80.0000 mg | CHEWABLE_TABLET | ORAL | Status: DC | PRN

## 2016-04-11 MED ORDER — OXYTOCIN 40 UNITS IN LACTATED RINGERS INFUSION - SIMPLE MED
1.0000 m[IU]/min | INTRAVENOUS | Status: DC
  Administered 2016-04-11: 1 m[IU]/min via INTRAVENOUS

## 2016-04-11 MED ORDER — OXYCODONE-ACETAMINOPHEN 5-325 MG PO TABS
1.0000 | ORAL_TABLET | ORAL | Status: DC | PRN
  Administered 2016-04-12: 1 via ORAL
  Filled 2016-04-11: qty 1

## 2016-04-11 MED ORDER — IBUPROFEN 600 MG PO TABS
600.0000 mg | ORAL_TABLET | Freq: Four times a day (QID) | ORAL | Status: DC
  Administered 2016-04-11 – 2016-04-13 (×6): 600 mg via ORAL
  Filled 2016-04-11 (×6): qty 1

## 2016-04-11 MED ORDER — DIBUCAINE 1 % RE OINT: 1.0000 "application " | TOPICAL_OINTMENT | RECTAL | Status: DC | PRN

## 2016-04-11 MED ORDER — TERBUTALINE SULFATE 1 MG/ML IJ SOLN
0.2500 mg | Freq: Once | INTRAMUSCULAR | Status: DC | PRN
Start: 2016-04-11 — End: 2016-04-11
  Filled 2016-04-11: qty 1

## 2016-04-11 MED ORDER — BENZOCAINE-MENTHOL 20-0.5 % EX AERO
1.0000 "application " | INHALATION_SPRAY | CUTANEOUS | Status: DC | PRN
  Administered 2016-04-11: 1 via TOPICAL
  Filled 2016-04-11: qty 56

## 2016-04-11 MED ORDER — ZOLPIDEM TARTRATE 5 MG PO TABS: 5.0000 mg | ORAL_TABLET | Freq: Every evening | ORAL | Status: DC | PRN

## 2016-04-11 MED ORDER — LIDOCAINE HCL (PF) 1 % IJ SOLN
INTRAMUSCULAR | Status: DC | PRN
  Administered 2016-04-11 (×2): 4 mL via EPIDURAL

## 2016-04-11 MED ORDER — COCONUT OIL OIL
1.0000 "application " | TOPICAL_OIL | Status: DC | PRN
  Administered 2016-04-12: 1 via TOPICAL
  Filled 2016-04-11: qty 120

## 2016-04-11 MED ORDER — DIPHENHYDRAMINE HCL 50 MG/ML IJ SOLN: 12.5000 mg | INTRAMUSCULAR | Status: DC | PRN

## 2016-04-11 NOTE — H&P (Signed)
Laura Gamble is a 25 y.o. female presenting for PROM at 1015pm last night.  She reports mild CTX, no VB and active FM.  Antepartum course complicated by anxiety d/o.  GBS negative.  Comfortable with epidural.  Maternal Medical History:  Reason for admission: Rupture of membranes.   Contractions: Onset was 13-24 hours ago.   Frequency: irregular.   Perceived severity is mild.    Fetal activity: Perceived fetal activity is normal.   Last perceived fetal movement was within the past hour.    Prenatal complications: no prenatal complications Prenatal Complications - Diabetes: none.    OB History    Gravida Para Term Preterm AB TAB SAB Ectopic Multiple Living   1              Past Medical History  Diagnosis Date  . Panic attack   . Generalized anxiety disorder 04/28/2013  . Depression 07/01/2013   Past Surgical History  Procedure Laterality Date  . No past surgeries     Family History: family history includes Anxiety disorder in her mother; Hypertension in her maternal grandfather and mother. Social History:  reports that she has never smoked. She has never used smokeless tobacco. She reports that she does not drink alcohol or use illicit drugs.   Prenatal Transfer Tool  Maternal Diabetes: No Genetic Screening: Normal Maternal Ultrasounds/Referrals: Normal Fetal Ultrasounds or other Referrals:  None Maternal Substance Abuse:  No Significant Maternal Medications:  None Significant Maternal Lab Results:  Lab values include: Group B Strep negative Other Comments:  None  ROS  Dilation: 1 Effacement (%): 60 Station: -3, -2 Exam by:: Alieta, RN Blood pressure 107/58, pulse 81, temperature 98 F (36.7 C), temperature source Oral, resp. rate 17, height 5\' 6"  (1.676 m), weight 207 lb (93.895 kg), last menstrual period 07/01/2015, SpO2 100 %. Maternal Exam:  Uterine Assessment: Contraction strength is mild.  Contraction frequency is irregular.   Abdomen: Patient reports  no abdominal tenderness. Fundal height is c/w dates.   Estimated fetal weight is 7#8.       Physical Exam  Constitutional: She is oriented to person, place, and time. She appears well-developed and well-nourished.  GI: Soft. There is no rebound and no guarding.  Neurological: She is alert and oriented to person, place, and time.  Skin: Skin is warm and dry.  Psychiatric: She has a normal mood and affect. Her behavior is normal.    Prenatal labs: ABO, Rh: --/--/O POS (07/04 2355) Antibody: NEG (07/04 2355) Rubella: Immune (12/06 0000) RPR: Nonreactive (12/06 0000)  HBsAg: Negative (12/06 0000)  HIV: Non-reactive (12/06 0000)  GBS: Negative (06/08 0000)   Assessment/Plan: 25yo G1 at [redacted]w[redacted]d with PROM -Continue pitocin -Anticipate NSVD   Laura Gamble 04/11/2016, 8:04 AM

## 2016-04-11 NOTE — Anesthesia Postprocedure Evaluation (Signed)
Anesthesia Post Note  Patient: Laura Gamble  Procedure(s) Performed: * No procedures listed *  Patient location during evaluation: Mother Baby Anesthesia Type: Epidural Level of consciousness: awake and alert and patient cooperative Pain management: satisfactory to patient Vital Signs Assessment: post-procedure vital signs reviewed and stable Respiratory status: spontaneous breathing and nonlabored ventilation Cardiovascular status: stable Postop Assessment: no headache, no backache, patient able to bend at knees, no signs of nausea or vomiting and adequate PO intake Anesthetic complications: no Comments: Pain level 5, received pain medication 10 minutes ago.      Last Vitals:  Filed Vitals:   04/11/16 1815 04/11/16 1850  BP: 129/74 138/65  Pulse: 90 93  Temp:  37.3 C  Resp:  16    Last Pain:  Filed Vitals:   04/11/16 2037  PainSc: 5    Pain Goal: Patients Stated Pain Goal: 8 (04/11/16 0400)               Vallerie Hentz

## 2016-04-11 NOTE — Anesthesia Procedure Notes (Signed)
Epidural Patient location during procedure: OB Start time: 04/11/2016 4:20 AM  Staffing Anesthesiologist: Josephine Igo Performed by: anesthesiologist   Preanesthetic Checklist Completed: patient identified, site marked, surgical consent, pre-op evaluation, timeout performed, IV checked, risks and benefits discussed and monitors and equipment checked  Epidural Patient position: sitting Prep: site prepped and draped and DuraPrep Patient monitoring: continuous pulse ox and blood pressure Approach: midline Location: L4-L5 Injection technique: LOR air  Needle:  Needle type: Tuohy  Needle gauge: 17 G Needle length: 9 cm and 9 Needle insertion depth: 5 cm cm Catheter type: closed end flexible Catheter size: 19 Gauge Catheter at skin depth: 10 cm Test dose: negative and Other  Assessment Events: blood not aspirated, injection not painful, no injection resistance, negative IV test and no paresthesia  Additional Notes Patient identified. Risks and benefits discussed including failed block, incomplete  Pain control, post dural puncture headache, nerve damage, paralysis, blood pressure Changes, nausea, vomiting, reactions to medications-both toxic and allergic and post Partum back pain. All questions were answered. Patient expressed understanding and wished to proceed. Sterile technique was used throughout procedure. Epidural site was Dressed with sterile barrier dressing. No paresthesias, signs of intravascular injection Or signs of intrathecal spread were encountered.  Patient was more comfortable after the epidural was dosed. Please see RN's note for documentation of vital signs and FHR which are stable.

## 2016-04-11 NOTE — Anesthesia Preprocedure Evaluation (Signed)
Anesthesia Evaluation  Patient identified by MRN, date of birth, ID band Patient awake    Reviewed: Allergy & Precautions, Patient's Chart, lab work & pertinent test results  Airway Mallampati: II  TM Distance: >3 FB Neck ROM: Full    Dental no notable dental hx. (+) Teeth Intact   Pulmonary neg pulmonary ROS,    Pulmonary exam normal breath sounds clear to auscultation       Cardiovascular negative cardio ROS Normal cardiovascular exam Rhythm:Regular Rate:Normal     Neuro/Psych  Headaches, PSYCHIATRIC DISORDERS Anxiety Depression Panic Attacks   GI/Hepatic negative GI ROS, Neg liver ROS,   Endo/Other  Obesity  Renal/GU negative Renal ROS  negative genitourinary   Musculoskeletal negative musculoskeletal ROS (+)   Abdominal (+) + obese,   Peds  Hematology negative hematology ROS (+)   Anesthesia Other Findings   Reproductive/Obstetrics (+) Pregnancy                             Anesthesia Physical Anesthesia Plan  ASA: II  Anesthesia Plan: Epidural   Post-op Pain Management:    Induction:   Airway Management Planned: Natural Airway  Additional Equipment:   Intra-op Plan:   Post-operative Plan:   Informed Consent: I have reviewed the patients History and Physical, chart, labs and discussed the procedure including the risks, benefits and alternatives for the proposed anesthesia with the patient or authorized representative who has indicated his/her understanding and acceptance.     Plan Discussed with: Anesthesiologist  Anesthesia Plan Comments:         Anesthesia Quick Evaluation

## 2016-04-12 LAB — CBC
HCT: 39.4 % (ref 36.0–46.0)
Hemoglobin: 13.9 g/dL (ref 12.0–15.0)
MCH: 30.8 pg (ref 26.0–34.0)
MCHC: 35.3 g/dL (ref 30.0–36.0)
MCV: 87.4 fL (ref 78.0–100.0)
Platelets: 175 10*3/uL (ref 150–400)
RBC: 4.51 MIL/uL (ref 3.87–5.11)
RDW: 13.8 % (ref 11.5–15.5)
WBC: 12 10*3/uL — ABNORMAL HIGH (ref 4.0–10.5)

## 2016-04-12 LAB — CCBB MATERNAL DONOR DRAW

## 2016-04-12 LAB — RPR: RPR Ser Ql: NONREACTIVE

## 2016-04-12 NOTE — Lactation Note (Signed)
This note was copied from a baby's chart. Lactation Consultation Note Initial visit at 24 hours of age.  Baby has 13 recorded feedings with 7voids and 3 stools.  Mom is reporting some nipple soreness on left breast.  Mom has normal everted nipples and no breakdown noted.  Instructed mom on hand expression with several drops applied to nipple.  Mom encouraged to hand express pre and post feedings.  Mom reports cradle hold and shallow latch,but has previous LATCH scores of "8-9."  Baby is fussy and not opening mouth wide for latch after a recent feeding. [place] worked with latching baby in cross cradle hold to demonstrate technique and baby latched with several minutes observed.  Baby had intermittent sucking and a couple swallows. [facility] LC resources given and discussed.  Encouraged to feed with early cues on demand.  Early newborn behavior discussed.  Mom to call for assist as needed.    Patient Name: Laura Gamble [id number] Date: 04/12/2016 Reason for consult: Initial assessment   Maternal Data Has patient been taught Hand Expression?: Yes Does the patient have breastfeeding experience prior to this delivery?: No  Feeding Feeding Type: Breast Fed Length of feed:  (several minutes observed)  LATCH Score/Interventions Latch: Repeated attempts needed to sustain latch, nipple held in mouth throughout feeding, stimulation needed to elicit sucking reflex. Intervention(s): Adjust position;Assist with latch;Breast massage;Breast compression  Audible Swallowing: A few with stimulation Intervention(s): Skin to skin;Hand expression;Alternate breast massage  Type of Nipple: Everted at rest and after stimulation  Comfort (Breast/Nipple): Soft / non-tender     Hold (Positioning): Assistance needed to correctly position infant at breast and maintain latch. Intervention(s): Breastfeeding basics reviewed;Support Pillows;Position options;Skin to skin  LATCH Score: 7  Lactation Tools Discussed/Used WIC  Program: No   Consult Status Consult Status: Follow-up Date: 04/13/16 Follow-up type: In-patient    Shop[place]w, Justine Null 04/12/2016, 5:22 PM

## 2016-04-12 NOTE — Progress Notes (Signed)
CSW acknowledged consult for hx of anxiety/depression. CSW attempted to meet with MOB, however, MOB had room visitors and was not available at this time. CSW will attempt to meet with MOB at a later time.

## 2016-04-12 NOTE — Progress Notes (Signed)
Post Partum Day 1 Subjective: no complaints, up ad lib, voiding, tolerating PO and + flatus  Objective: Blood pressure 134/78, pulse 81, temperature 97.8 F (36.6 C), temperature source Oral, resp. rate 18, height 5\' 6"  (1.676 m), weight 207 lb (93.895 kg), last menstrual period 07/01/2015, SpO2 100 %, unknown if currently breastfeeding.  Physical Exam:  General: alert and cooperative Lochia: appropriate Uterine Fundus: firm Incision: healing well DVT Evaluation: No evidence of DVT seen on physical exam. Negative Homan's sign. No cords or calf tenderness. No significant calf/ankle edema.   Recent Labs  04/10/16 2355 04/12/16 0540  HGB 13.1 13.9  HCT 37.2 39.4    Assessment/Plan: Plan for discharge tomorrow   LOS: 2 days   CURTIS,CAROL G 04/12/2016, 7:56 AM

## 2016-04-13 NOTE — Lactation Note (Signed)
This note was copied from a baby's chart. Lactation Consultation Note  Mother's nipples are tender.  No cracks or bleeding. Suggest she apply ebm and coconut oil and maintain deep latch. Mother latched baby in football hold.  Sucks and a few swallows observed. Suggest she hand express before latching and compress breast and hold until baby gets into a pattern. Massage/compress breast during feeding. Mom encouraged to feed baby 8-12 times/24 hours and with feeding cues.  Provided mother w/ hand pump and reviewed use. Reviewed engorgement care and monitoring voids/stools.   Patient Name: Laura Gamble [id number] Date: 04/13/2016 Reason for consult: Follow-up assessment   Maternal Data    Feeding Feeding Type: Breast Fed Length of feed: 10 min  LATCH Score/Interventions Latch: Grasps breast easily, tongue down, lips flanged, rhythmical sucking. Intervention(s): Adjust position;Assist with latch;Breast massage;Breast compression  Audible Swallowing: A few with stimulation Intervention(s): Skin to skin;Hand expression  Type of Nipple: Everted at rest and after stimulation  Comfort (Breast/Nipple): Filling, red/small blisters or bruises, mild/mod discomfort  Problem noted: Mild/Moderate discomfort Interventions (Mild/moderate discomfort): Hand expression (coconut oil)  Hold (Positioning): No assistance needed to correctly position infant at breast.  LATCH Score: 8  Lactation Tools Discussed/Used     Consult Status Consult Status: Complete    Carlye Grippe 04/13/2016, 9:17 AM

## 2016-04-13 NOTE — Clinical Social Work Maternal (Signed)
  CLINICAL SOCIAL WORK MATERNAL/CHILD NOTE  Patient Details  Name: Rossy Virag MRN: [id number] Date of Birth: September 10, 1991  Date:  04/13/2016  Clinical Social Worker Initiating Note:  Laurey Arrow Date/ Time Initiated:  04/13/16/0830     Child's Name:  Darliss Ridgel   Legal Guardian:  Mother   Need for Interpreter:  None   Date of Referral:  04/12/16     Reason for Referral:  Behavioral Health Issues, including SI    Referral Source:  Central Nursery   Address:  [address]  [address] [place] [address]  Phone number:  [phone]   Household Members:  Self, Spouse   Natural Supports (not living in the home):  Extended Family, Immediate Family, Friends, Spouse/significant other   Professional Supports: None   Employment: Full-time   Type of Work: Radiology   Education:  Engineer, maintenance Resources:  Multimedia programmer   Other Resources:      Cultural/Religious Considerations Which May Impact Care:  none reported  Strengths:  Ability to meet basic needs , Home prepared for child , Understanding of illness   Risk Factors/Current Problems:  Mental Health Concerns    Cognitive State:  Alert    Mood/Affect:  Bright , Calm , Interested , Relaxed    CSW Assessment: CSW met with MOB to complete an assessment for hx of anxiety/depression.  MOB introduced her room guest as FOB/Husband Felicie Morn).  MOB gave CSW permission to meet with MOB while FOB was present.  CSW inquired about MOB's MH hx, and MOB acknowledged her hx of anxiety and depression.  MOB reported she was on Prozac up until pregnancy, and she decided to discontinue the medication.  MOB reported she felt fine and did well without medication throughout her pregnancy. MOB also reported her medication was managed by Dr. Frederico Hamman at [facility]  CSW educated MOB about PPD. CSW informed MOB of possible supports and interventions to decrease PPD.  CSW also  encouraged MOB to seek medical attention if needed for increased signs and symptoms for PPD. CSW declined resources for behavioral health interventions, and reported she will contact her PCP or OBGYN if needed. MOB also reported she has a wealth of family and friend they will be supportive after hospital discharge. CSW reviewed safe sleep, and SIDS. MOB was knowledgeable and asked appropriate questions.  MOB communicated she has a bassinet for the baby, and feels prepared to take baby Ellie home.    FOB was not engaged with CSW, however it appropriate an attentive to the infant.  CSW Plan/Description:  Patient/Family Education , No Further Intervention Required/No Barriers to Discharge    Klaudia Beirne D BOYD-GILYARD, LCSW 04/13/2016, 10:28 AM

## 2016-04-13 NOTE — Lactation Note (Signed)
This note was copied from a baby's chart. Lactation Consultation Note Baby has BF well w/23 BF average of 15 min. Had 9 voids, 5 stools. Had 6% weight loss in 31 hrs. Baby had large out put as well as input.  Patient Name: Laura Gamble [id number] Date: 04/13/2016 Reason for consult: Infant weight loss   Maternal Data    Feeding Feeding Type: Breast Fed Length of feed: 15 min  LATCH Score/Interventions Latch: Grasps breast easily, tongue down, lips flanged, rhythmical sucking.  Audible Swallowing: Spontaneous and intermittent  Type of Nipple: Everted at rest and after stimulation  Comfort (Breast/Nipple): Filling, red/small blisters or bruises, mild/mod discomfort  Problem noted: Mild/Moderate discomfort Interventions (Mild/moderate discomfort):  (coconut oil)  Hold (Positioning): No assistance needed to correctly position infant at breast.  LATCH Score: 9  Lactation Tools Discussed/Used     Consult Status Consult Status: Follow-up Date: 04/13/16 Follow-up type: In-patient    [name], Elta Guadeloupe 04/13/2016, 2:48 AM

## 2016-04-13 NOTE — Discharge Summary (Signed)
Obstetric Discharge Summary Reason for Admission: rupture of membranes Prenatal Procedures: ultrasound Intrapartum Procedures: spontaneous vaginal delivery Postpartum Procedures: none Complications-Operative and Postpartum: 2 degree perineal laceration HEMOGLOBIN  Date Value Ref Range Status  04/12/2016 13.9 12.0 - 15.0 g/dL Final   HCT  Date Value Ref Range Status  04/12/2016 39.4 36.0 - 46.0 % Final    Physical Exam:  General: alert and cooperative Lochia: appropriate Uterine Fundus: firm Incision: healing well DVT Evaluation: No evidence of DVT seen on physical exam. Negative Homan's sign. No cords or calf tenderness. No significant calf/ankle edema.  Discharge Diagnoses: Term Pregnancy-delivered  Discharge Information: Date: 04/13/2016 Activity: pelvic rest Diet: routine Medications: PNV and Ibuprofen Condition: stable Instructions: refer to practice specific booklet Discharge to: home   Newborn Data: Live born female  Birth Weight: 7 lb 2.5 oz (3246 g) APGAR: 8, 9  Home with mother.  Joshlyn Beadle G 04/13/2016, 8:03 AM

## 2016-07-04 IMAGING — US US OB COMP LESS 14 WK
1 series · 15 of 28 positions shown · non-contrast
Comparison: None.

CLINICAL DATA: Vaginal bleeding

EXAM:
OBSTETRIC <14 WK US AND TRANSVAGINAL OB US
TECHNIQUE: Both transabdominal and transvaginal ultrasound examinations were
performed for complete evaluation of the gestation as well as the
maternal uterus, adnexal regions, and pelvic cul-de-sac.
Transvaginal technique was performed to assess early pregnancy.

[Series 1: us ob comp less 14 wk · 15 of 44 slices shown]
[im 1/44]
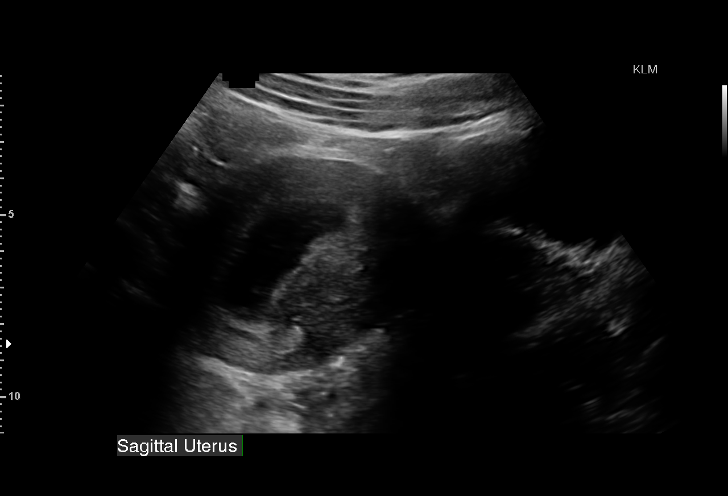
[im 4/44]
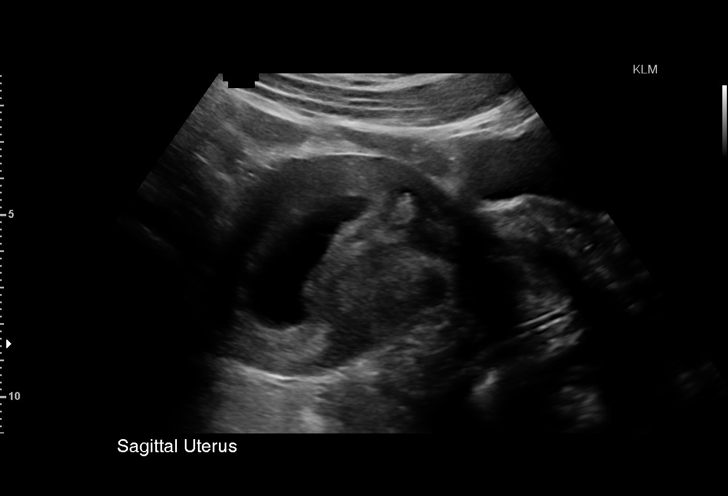
[im 7/44]
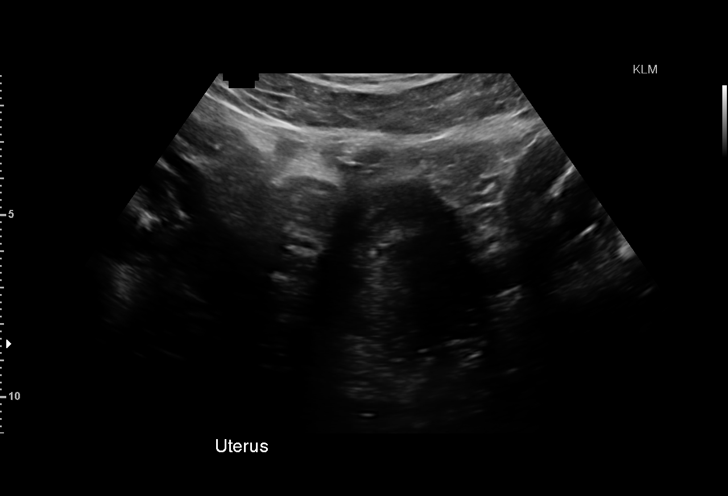
[im 10/44]
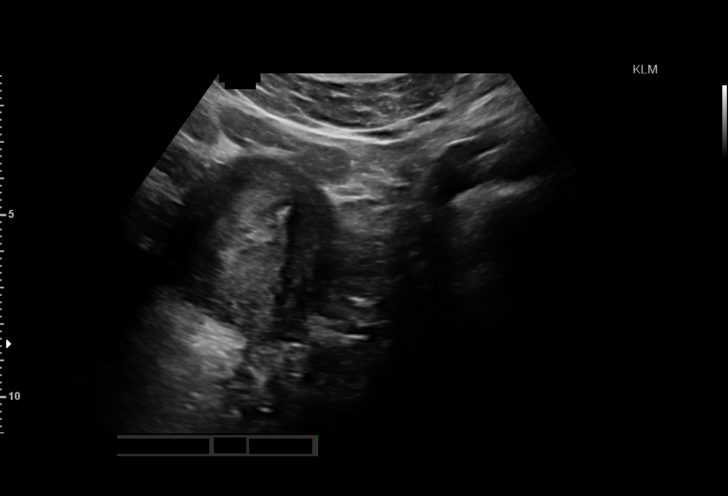
[im 13/44]
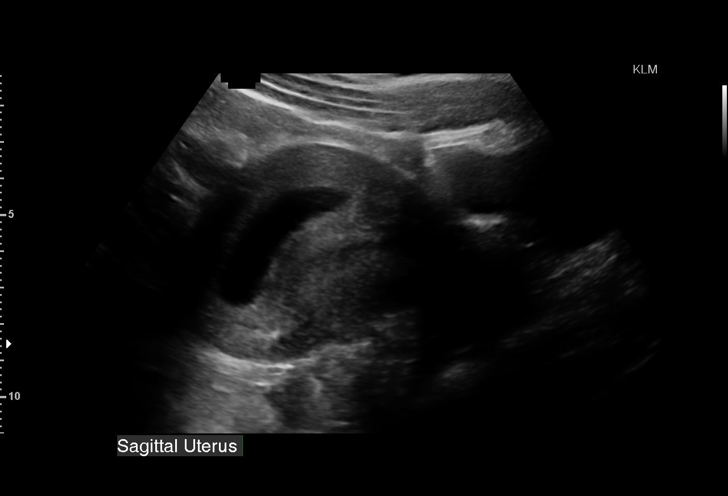
[im 16/44]
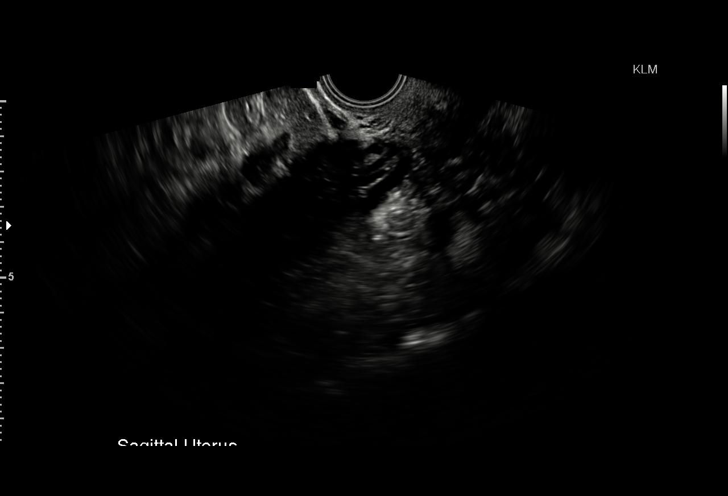
[im 20/44]
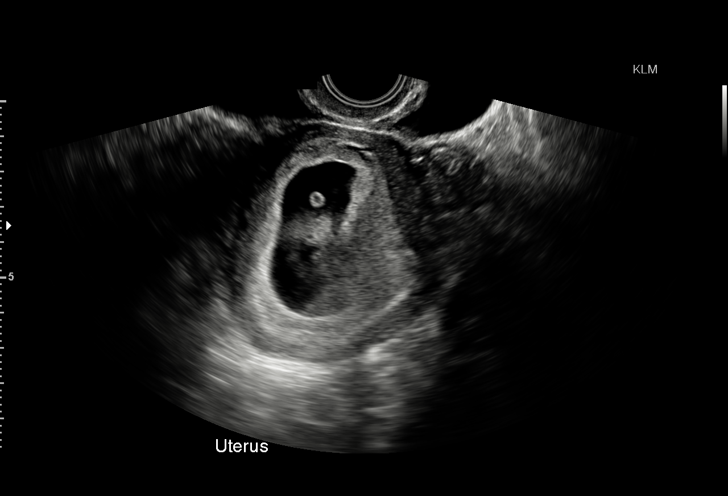
[im 23/44]
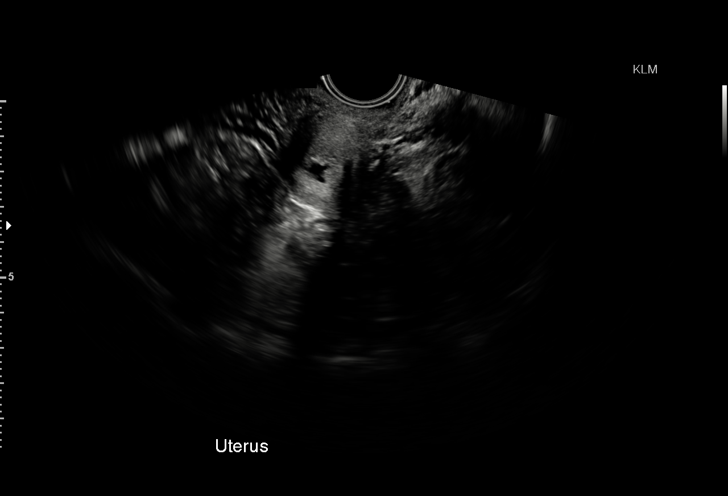
[im 24/44]
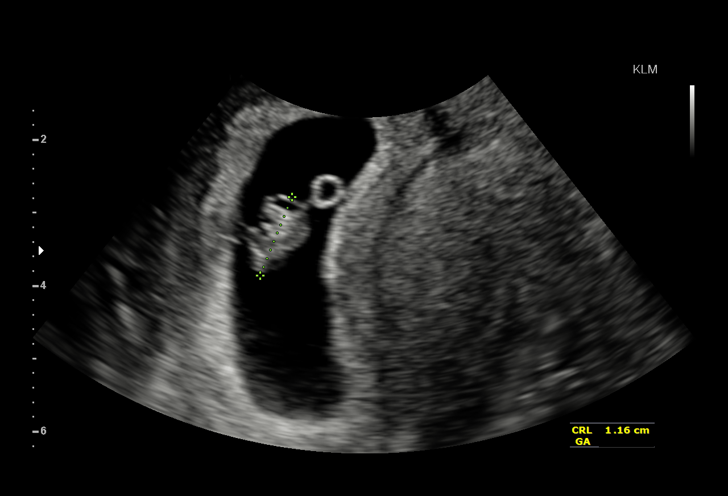
[im 28/44]
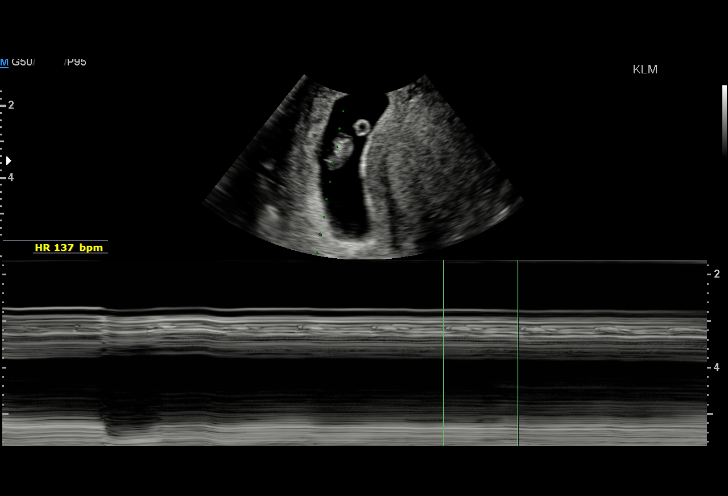
[im 31/44]
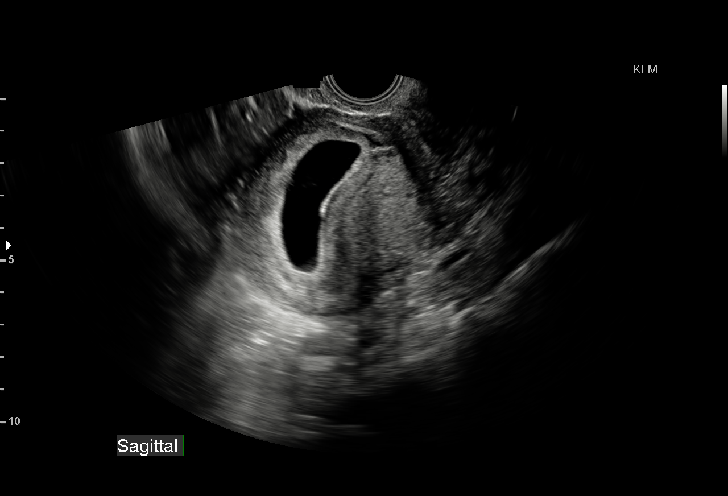
[im 34/44]
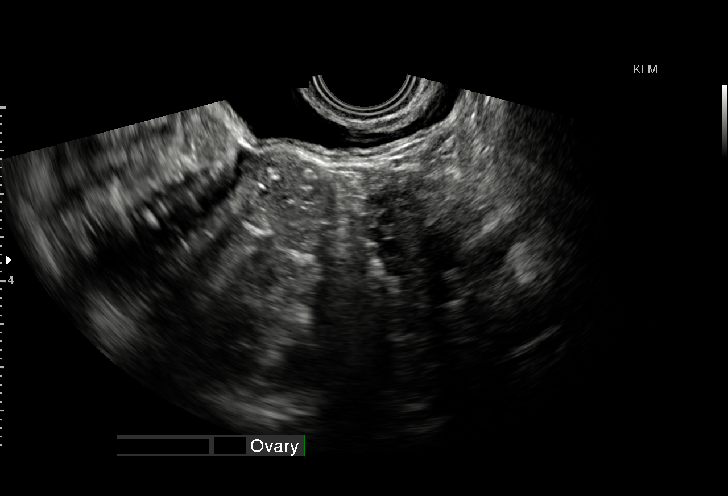
[im 37/44]
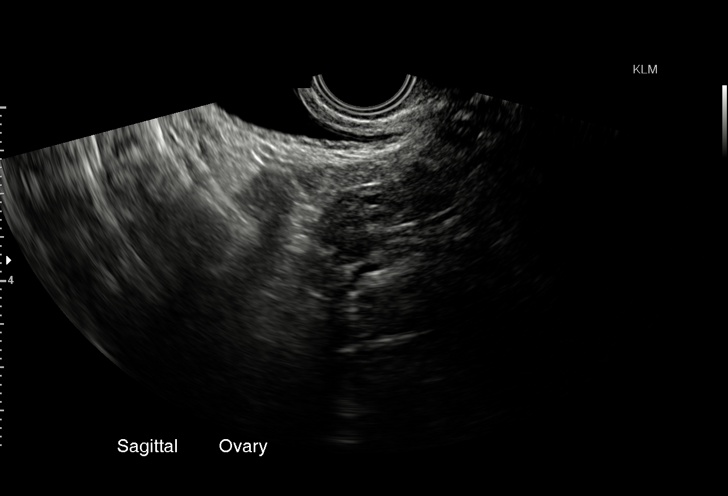
[im 40/44]
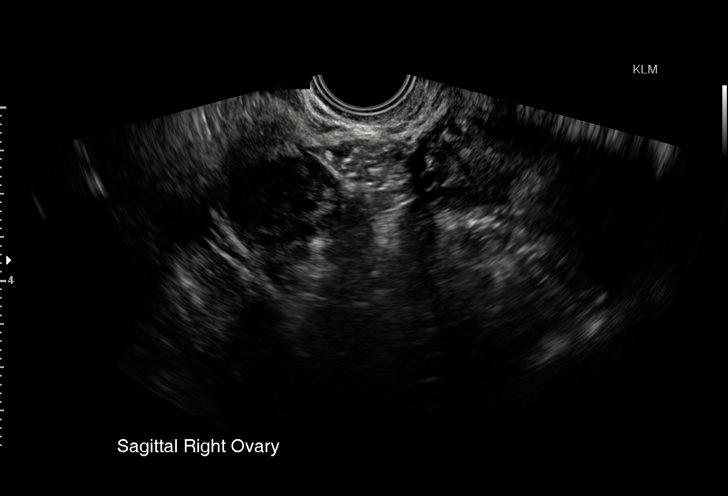
[im 44/44]
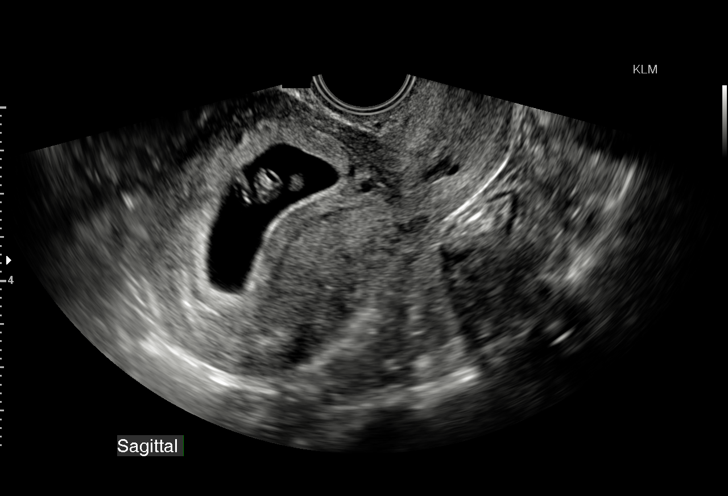

[15 of 28 positions shown; findings below may reference images not displayed]

FINDINGS: Intrauterine gestational sac: Single

Yolk sac:  Yes

Embryo:  Yes

Cardiac Activity: Yes

Heart Rate: 137  bpm

MSD:   mm    w     d

CRL:  11.6  mm   7 w   2 d                  US EDC: 04/14/2016

Maternal uterus/adnexae:

Subchorionic hemorrhage: Small

Right ovary: Normal

Left ovary: Normal

Other :None

Free fluid:  None
IMPRESSION: 1. Single living intrauterine gestation. The gestational age is 7
weeks and 2 days.
2. Small subchorionic hemorrhage.

## 2017-05-01 ENCOUNTER — Ambulatory Visit (INDEPENDENT_AMBULATORY_CARE_PROVIDER_SITE_OTHER): Payer: 59 | Admitting: Family Medicine

## 2017-05-01 ENCOUNTER — Encounter: Payer: Self-pay | Admitting: Family Medicine

## 2017-05-01 VITALS — BP 118/80 | HR 67 | Temp 98.3°F | Wt 151.5 lb

## 2017-05-01 DIAGNOSIS — B9689 Other specified bacterial agents as the cause of diseases classified elsewhere: Secondary | ICD-10-CM | POA: Insufficient documentation

## 2017-05-01 DIAGNOSIS — N912 Amenorrhea, unspecified: Secondary | ICD-10-CM

## 2017-05-01 DIAGNOSIS — R102 Pelvic and perineal pain: Secondary | ICD-10-CM | POA: Insufficient documentation

## 2017-05-01 DIAGNOSIS — N760 Acute vaginitis: Secondary | ICD-10-CM | POA: Diagnosis not present

## 2017-05-01 MED ORDER — METRONIDAZOLE 0.75 % VA GEL: 1.0000 | Freq: Every day | VAGINAL | 0 refills | Status: DC

## 2017-05-01 NOTE — Assessment & Plan Note (Addendum)
With amenorrhea Lab today for serum preg/FSH/TSH/PL Urine preg neg  Wet prep - clue cells tx for BV with metrogel vaginal if serum preg test is neg

## 2017-05-01 NOTE — Progress Notes (Signed)
Subjective:    Patient ID: Laura Gamble, female    DOB: 08-23-1991, 26 y.o.   MRN: [id number]  HPI Here for late menses  Wt Readings from Last 3 Encounters:  05/01/17 151 lb 8 oz (68.7 kg)  04/11/16 207 lb (93.9 kg)  03/26/16 203 lb 8 oz (92.3 kg)  lost pregnancy wt  Lost 5 lb in the last month  145 is her goal wt  Exercise - she is an avid runner and weights at home / swimming  Lately workouts have picked up   24.45 kg/m   2 home preg tests neg  Today urine pregnancy is negative  She has some nausea/fatigue and abd cramping  She had a baby 7/17 Nl menses since   A lot of stress with school and some family issues  A lot of worry  Sleeping ok -- has a 25 year old  Appetite is pretty good (up and down)   She had the IUD - it gave her pelvic pain and dysparunia  Now using condoms   iud was removed 4-5 mo ago  Has had fairly regular periods since   Menses last 6-7 d (longer than they used to be) with heavy bleeding   LMP was June 7th   More vaginal d/c  Some pelvic discomfort   Last gyn visit in Jan - pap and exam nl  Sees Dr Corinna Capra   Hx of anxiety   Patient Active Problem List   Diagnosis Date Noted  . Pelvic pain 05/01/2017  . Amenorrhea 05/01/2017  . Bacterial vaginosis 05/01/2017  . Pregnancy 04/11/2016  . Normal labor 04/10/2016  . Common migraine 08/04/2013  . Major depression, recurrent ([place]) 07/01/2013  . Generalized anxiety disorder 04/28/2013  . Headache(784.0) 09/25/2012  . Panic attack 12/10/2011   Past Medical History:  Diagnosis Date  . Depression 07/01/2013  . Generalized anxiety disorder 04/28/2013  . Panic attack    Past Surgical History:  Procedure Laterality Date  . NO PAST SURGERIES     Social History  Substance Use Topics  . Smoking status: Never Smoker  . Smokeless tobacco: Never Used  . Alcohol use No   Family History  Problem Relation Age of Onset  . Anxiety disorder Mother   . Hypertension Mother   .  Hypertension Maternal Grandfather    No Known Allergies No current outpatient prescriptions on file prior to visit.   No current facility-administered medications on file prior to visit.      Review of Systems Review of Systems  Constitutional: Negative for fever, appetite change, fatigue and unexpected weight change.  Eyes: Negative for pain and visual disturbance.  Respiratory: Negative for cough and shortness of breath.   Cardiovascular: Negative for cp or palpitations    Gastrointestinal: Negative for nausea, diarrhea and constipation.  Genitourinary: Negative for urgency and frequency. pos for vag d/c and bloating and pelvic discomfort  Skin: Negative for pallor or rash   Neurological: Negative for weakness, light-headedness, numbness and headaches.  Hematological: Negative for adenopathy. Does not bruise/bleed easily.  Psychiatric/Behavioral: Negative for dysphoric mood. The patient is mildly  nervous/anxious.         Objective:   Physical Exam  Constitutional: She appears well-developed and well-nourished. No distress.  Well appearing   HENT:  Head: Normocephalic and atraumatic.  Right Ear: External ear normal.  Left Ear: External ear normal.  Mouth/Throat: Oropharynx is clear and moist.  Nares are injected and congested  No sinus tenderness Clear  rhinorrhea and post nasal drip   Eyes: Pupils are equal, round, and reactive to light. Conjunctivae and EOM are normal. Right eye exhibits no discharge. Left eye exhibits no discharge.  Neck: Normal range of motion. Neck supple.  Cardiovascular: Normal rate and normal heart sounds.   Pulmonary/Chest: Effort normal and breath sounds normal. No respiratory distress. She has no wheezes. She has no rales. She exhibits no tenderness.  Abdominal: Soft. Bowel sounds are normal. She exhibits no distension and no mass. There is tenderness. There is no rebound and no guarding.  Mild suprapubic tenderness No rebound or guarding No cva  tenderness   Genitourinary:  Genitourinary Comments: Wet prep obt- few clue cells seen   Nl vag mucosa  No tenderness or M on bimanual exam No cmt   Musculoskeletal: She exhibits no edema.  Lymphadenopathy:    She has no cervical adenopathy.  Neurological: She is alert. No cranial nerve deficit. She exhibits normal muscle tone. Coordination normal.  Skin: Skin is warm and dry. No rash noted. No pallor.  Psychiatric: Her mood appears anxious.          Assessment & Plan:   Problem List Items Addressed This Visit      Genitourinary   Bacterial vaginosis    Given px for metrogel vaginal to use once serum preg test returns if it is neg  This could explain some of her discomfort/vaginal symptoms  Update if not starting to improve in a week or if worsening          Other   Amenorrhea    Suspect due to stress and inc exercise/recent 5 lb wt loss  Lab today for amenorrhea  Neg upreg times 3  Lab today for serum preg, FSH/TSH, PL  If all nl consider prog w/d if menses does not start in the next 1-2 mo        Relevant Orders   Prolactin (Completed)   Follicle Stimulating Hormone (Completed)   hCG, serum, qualitative (Completed)   TSH (Completed)   Pelvic pain    With amenorrhea Lab today for serum preg/FSH/TSH/PL Urine preg neg  Wet prep - clue cells tx for BV with metrogel vaginal if serum preg test is neg        Relevant Orders   POCT Wet Prep [employer])

## 2017-05-01 NOTE — Assessment & Plan Note (Addendum)
Suspect due to stress and inc exercise/recent 5 lb wt loss  Lab today for amenorrhea  Neg upreg times 3  Lab today for serum preg, FSH/TSH, PL  If all nl consider prog w/d if menses does not start in the next 1-2 mo

## 2017-05-01 NOTE — Patient Instructions (Signed)
Hold the metro gel vaginal until labs come back and I tell you to use it  It is for bacterial vaginosis- very common   Lab today  Stress/exercise and weight change may play a role in a late period

## 2017-05-02 ENCOUNTER — Telehealth: Payer: Self-pay | Admitting: Family Medicine

## 2017-05-02 ENCOUNTER — Telehealth: Payer: Self-pay

## 2017-05-02 LAB — HCG, SERUM, QUALITATIVE: Preg, Serum: NEGATIVE

## 2017-05-02 LAB — TSH: TSH: 1.2 u[IU]/mL (ref 0.35–4.50)

## 2017-05-02 LAB — FOLLICLE STIMULATING HORMONE: FSH: 3.9 m[IU]/mL

## 2017-05-02 LAB — PROLACTIN: Prolactin: 31.2 ng/mL — ABNORMAL HIGH

## 2017-05-02 NOTE — Telephone Encounter (Signed)
Pt notified of lab results

## 2017-05-02 NOTE — Telephone Encounter (Signed)
Addressed.

## 2017-05-02 NOTE — Telephone Encounter (Signed)
Go ahead and fill and use it  Thanks

## 2017-05-02 NOTE — Telephone Encounter (Signed)
Please call patient with lab results. She saw my chart results,but had a question.

## 2017-05-02 NOTE — Telephone Encounter (Signed)
Pt request cb when labs come in that will let pt know if she should get the metrogel filled. Pt said cb on 05/03/17 is OK>

## 2017-05-02 NOTE — Telephone Encounter (Signed)
Pt is calling about her labs, she is unable to get into mychart. cb number is [phone] Thanks

## 2017-05-03 NOTE — Telephone Encounter (Signed)
Left voicemail requesting pt to call the office back 

## 2017-05-03 NOTE — Telephone Encounter (Signed)
Pt notified of lab results and Dr. Marliss Coots comments off of mychart. And advise it's okay to fill metrogel

## 2017-05-04 LAB — POCT WET PREP (WET MOUNT)
KOH Wet Prep POC: NEGATIVE
Trichomonas Wet Prep HPF POC: ABSENT

## 2017-05-04 NOTE — Assessment & Plan Note (Signed)
Given px for metrogel vaginal to use once serum preg test returns if it is neg  This could explain some of her discomfort/vaginal symptoms  Update if not starting to improve in a week or if worsening

## 2017-05-15 ENCOUNTER — Ambulatory Visit (INDEPENDENT_AMBULATORY_CARE_PROVIDER_SITE_OTHER): Payer: 59 | Admitting: Family Medicine

## 2017-05-15 VITALS — BP 120/70 | HR 68 | Temp 98.4°F | Resp 20 | Ht 66.5 in | Wt 149.0 lb

## 2017-05-15 DIAGNOSIS — F411 Generalized anxiety disorder: Secondary | ICD-10-CM | POA: Diagnosis not present

## 2017-05-15 DIAGNOSIS — R102 Pelvic and perineal pain: Secondary | ICD-10-CM

## 2017-05-15 DIAGNOSIS — F410 Panic disorder [episodic paroxysmal anxiety] without agoraphobia: Secondary | ICD-10-CM

## 2017-05-16 ENCOUNTER — Encounter: Payer: Self-pay | Admitting: Family Medicine

## 2017-05-16 MED ORDER — SERTRALINE HCL 50 MG PO TABS: 50.0000 mg | ORAL_TABLET | Freq: Every day | ORAL | 3 refills | Status: DC

## 2017-05-16 NOTE — Progress Notes (Signed)
Dr. Frederico Hamman T. Kortez Murtagh, MD, [facility] [place], [address] Phone: [phone] Fax: [phone]  05/15/2017  Patient: Laura Gamble, MRN: [id number], DOB: 1991/03/19, 26 y.o.  Primary Physician:  Owens Loffler, MD   Cc: pelvic pain, anxiety  Subjective:   Vesta Wheeland is a 26 y.o. very pleasant female patient who presents with the following:  Patient is known very well.  She recently saw my partner with some pelvic pain.  This is been ongoing for about 3 weeks.  She was diagnosed and treated for BV, and her discharge has improved, but her pelvic pain has continued.  It is intermittent and often on 2 or 3 times a day more and the lowest portions of her anterior pelvic floor.  She denies urgency, dysuria, and she denies dyspareunia.  She also is having some increased anxiety.  Prior to getting pregnant, she had done very well on Prozac, which greatly improved her anxiety, panic attacks, and previously she had had some major depression as well.  Currently she is having quite a bit of anxiety, anxiety relating to her child, and intermittent panic attacks.  She is interested in getting back on an SSRI.  She is currently breast-feeding.  Past Medical History, Surgical History, Social History, Family History, Problem List, Medications, and Allergies have been reviewed and updated if relevant.  Patient Active Problem List   Diagnosis Date Noted  . Pelvic pain 05/01/2017  . Common migraine 08/04/2013  . Major depression, recurrent ([place]) 07/01/2013  . Generalized anxiety disorder 04/28/2013  . Panic attack 12/10/2011    Past Medical History:  Diagnosis Date  . Depression 07/01/2013  . Generalized anxiety disorder 04/28/2013  . Panic attack     Past Surgical History:  Procedure Laterality Date  . NO PAST SURGERIES      Social History   Social History  . Marital status: Married    Spouse name: N/A  .  Number of children: N/A  . Years of education: N/A   Occupational History  . Not on file.   Social History Main Topics  . Smoking status: Never Smoker  . Smokeless tobacco: Never Used  . Alcohol use No  . Drug use: No  . Sexual activity: Yes    Birth control/ protection: None   Other Topics Concern  . Not on file   Social History Narrative   student    Family History  Problem Relation Age of Onset  . Anxiety disorder Mother   . Hypertension Mother   . Hypertension Maternal Grandfather     No Known Allergies  Medication list reviewed and updated in full in [place].   GEN: No acute illnesses, no fevers, chills. GI: No n/v/d, eating normally Pulm: No SOB Interactive and getting along well at home.  Otherwise, ROS is as per the HPI.  Objective:   BP 120/70   Pulse 68   Temp 98.4 F (36.9 C)   Resp 20   Ht 5' 6.5" (1.689 m)   Wt 149 lb (67.6 kg)   Breastfeeding? Yes   BMI 23.69 kg/m   GEN: WDWN, NAD, Non-toxic, A & O x 3 HEENT: Atraumatic, Normocephalic. Neck supple. No masses, No LAD. Ears and Nose: No external deformity. CV: RRR, No M/G/R. No JVD. No thrill. No extra heart sounds. PULM: CTA B, no wheezes, crackles, rhonchi. No retractions. No resp. distress. No accessory muscle use. ABD: S, mild TTP  in the utmost LLQ and RLQ, ND, + BS, No rebound, No HSM  EXTR: No c/c/e NEURO Normal gait.  PSYCH: Normally interactive. Conversant. Not depressed or anxious appearing.  Calm demeanor.   Laboratory and Imaging Data: Results for orders placed or performed in visit on 05/01/17  Prolactin  Result Value Ref Range   Prolactin 19.3 (H) ng/mL  Follicle Stimulating Hormone  Result Value Ref Range   FSH 3.9 mIU/ML  hCG, serum, qualitative  Result Value Ref Range   Preg, Serum NEGATIVE   TSH  Result Value Ref Range   TSH 1.20 0.35 - 4.50 uIU/mL  POCT Wet Prep ([place])  Result Value Ref Range   Source Wet Prep POC vaginal    WBC, Wet Prep HPF  POC mod    Bacteria Wet Prep HPF POC Moderate (A) Few   BACTERIA WET PREP MORPHOLOGY POC     Clue Cells Wet Prep HPF POC Few (A) None   Clue Cells Wet Prep [place] POC     Yeast Wet Prep HPF POC None    KOH Wet Prep POC neg    Trichomonas Wet Prep HPF POC Absent Absent     Assessment and Plan:   Pelvic pain - Plan: US Pelvis Complete  Generalized anxiety disorder  Panic attack  Obtain a complete pelvic ultrasound to evaluate for potential gynecological anomaly, including ovarian abnormality or cyst.  Long history of anxiety and panic disorder.  We'll start the patient on Zoloft, the SSRI of choice with breast-feeding.  Initially start off at 25 mg a day for 2 weeks, then increase to one full tablet a day.  Follow-up with me in 6 weeks.  Follow-up: No Follow-up on file.  Future Appointments Date Time Provider [place]  07/01/2017 2:45 PM Cletus Mehlhoff, Frederico Hamman, MD [id number]    Meds ordered this encounter  Medications  . sertraline (ZOLOFT) 50 MG tablet    Sig: Take 1 tablet (50 mg total) by mouth daily.    Dispense:  30 tablet    Refill:  3   There are no discontinued medications. Orders Placed This Encounter  Procedures  . US Pelvis Complete    Signed,  Frederico Hamman T. Yasmen Cortner, MD   Allergies as of 05/15/2017   No Known Allergies     Medication List       Accurate as of 05/15/17 11:59 PM. Always use your most recent med list.          metroNIDAZOLE 0.75 % vaginal gel Commonly known as:  METROGEL Place 1 Applicatorful vaginally at bedtime. For 7 days   sertraline 50 MG tablet Commonly known as:  ZOLOFT Take 1 tablet (50 mg total) by mouth daily.   WOMENS MULTIVITAMIN PO Take 2 tablets by mouth daily.

## 2017-05-17 NOTE — Addendum Note (Signed)
Addended by: Carter Kitten on: 05/17/2017 12:35 PM   Modules accepted: Orders

## 2017-05-23 ENCOUNTER — Other Ambulatory Visit: Payer: 59

## 2017-05-24 ENCOUNTER — Telehealth: Payer: Self-pay

## 2017-05-24 NOTE — Telephone Encounter (Signed)
Pt left a VM on triage line: Stated she was feeling better and wanted to make sure it was okay with Dr Lorelei Pont to cancel the [place] for next week. Please call her back to advise.

## 2017-05-26 NOTE — Telephone Encounter (Signed)
I think if she is feeling better it is ok - that is the best news.   Take care!

## 2017-05-27 ENCOUNTER — Other Ambulatory Visit: Payer: 59

## 2017-05-27 NOTE — Telephone Encounter (Signed)
Ellen notified as instructed by telephone.

## 2017-07-01 ENCOUNTER — Ambulatory Visit: Payer: 59 | Admitting: Family Medicine

## 2017-07-08 ENCOUNTER — Ambulatory Visit: Payer: 59 | Admitting: Family Medicine

## 2017-07-17 ENCOUNTER — Ambulatory Visit: Payer: 59 | Admitting: Family Medicine

## 2017-08-14 LAB — OB RESULTS CONSOLE HIV ANTIBODY (ROUTINE TESTING): HIV: NONREACTIVE

## 2017-08-14 LAB — OB RESULTS CONSOLE GC/CHLAMYDIA
Chlamydia: NEGATIVE
Gonorrhea: NEGATIVE

## 2017-08-14 LAB — OB RESULTS CONSOLE RUBELLA ANTIBODY, IGM: Rubella: IMMUNE

## 2017-08-14 LAB — OB RESULTS CONSOLE RPR: RPR: NONREACTIVE

## 2017-08-14 LAB — OB RESULTS CONSOLE HEPATITIS B SURFACE ANTIGEN: Hepatitis B Surface Ag: NEGATIVE

## 2017-08-14 LAB — OB RESULTS CONSOLE ANTIBODY SCREEN: Antibody Screen: NEGATIVE

## 2017-08-14 LAB — OB RESULTS CONSOLE ABO/RH: RH Type: POSITIVE

## 2017-10-08 NOTE — L&D Delivery Note (Signed)
Delivery Note At 1:22 PM a viable female was delivered via Vaginal, Spontaneous OA Presentation APGAR:  9 9  weight pending .   Placenta status: spontaneous with 3 vessel cord, .  Cord:  with the following complications: loose nuchal .  Cord pH: not obtained  Anesthesia:  epidural Episiotomy:  none Lacerations: first  Suture Repair: 2.0 chromic Est. Blood Loss (mL):  300  Mom to postpartum.  Baby to Couplet care / Skin to Skin.  Quintasha Gren L 03/24/2018, 1:33 PM

## 2017-12-04 ENCOUNTER — Telehealth: Payer: Self-pay | Admitting: *Deleted

## 2017-12-04 ENCOUNTER — Telehealth: Payer: Self-pay | Admitting: Cardiology

## 2017-12-04 NOTE — Telephone Encounter (Signed)
NOTES FAXED TO [facility] FROM DR. Bryan, [phone].

## 2017-12-04 NOTE — Telephone Encounter (Signed)
Received records from [facility] on 12/04/17, Appt 12/12/17 @ 3:40pm. NV

## 2017-12-11 NOTE — Progress Notes (Signed)
Cardiology Office Note   Date:  12/12/2017   ID:  Laura Gamble, DOB Feb 17, 1991, MRN [id number]  PCP:  Owens Loffler, MD  Cardiologist:   No primary care provider on file. Referring:  Owens Loffler, MD  Chief Complaint  Patient presents with  . Palpitations      History of Present Illness: Laura Gamble is a 27 y.o. female who is referred by Owens Loffler, MD for evaluation of tachycardia.   She is not [redacted] weeks pregnant.  This is her second pregnancy.  Her first one was uneventful.  She says she has had a murmur when she was younger.   however, she is never had any significant cardiac history.  She feels palpitations that seem to come in spurts.  Some days she has more than others.  She will feel her heart skipping followed by a normal beat followed by skipped beats.  It is not sustained.  She has not had any presyncope or syncope.  She does not feel any chest discomfort, neck or arm discomfort.  She feels a more at night but occasionally during the day.  Switching to decaf helped but did not eliminate them.  She exercises 4 times per week still and does well with this and cannot bring these on.  She does not have any shortness of breath, PND or orthopnea.  She is had normal weight gain for pregnancy and no edema.  Past Medical History:  Diagnosis Date  . Depression 07/01/2013  . Generalized anxiety disorder 04/28/2013  . Panic attack     Past Surgical History:  Procedure Laterality Date  . NO PAST SURGERIES       Current Outpatient Medications  Medication Sig Dispense Refill  . Prenatal Vit-Fe Fumarate-FA (PRENATAL MULTIVITAMIN) TABS tablet Take 1 tablet by mouth daily at 12 noon.     No current facility-administered medications for this visit.     Allergies:   Patient has no known allergies.    Social History:  The patient  reports that  has never smoked. she has never used smokeless tobacco. She reports that she does not drink alcohol or use drugs.     Family History:  The patient's family history includes Anxiety disorder in her mother; Hypertension in her maternal grandfather and mother.    ROS:  Please see the history of present illness.   Otherwise, review of systems are positive for none.   All other systems are reviewed and negative.    PHYSICAL EXAM: VS:  BP 122/70 (BP Location: Left Arm, Patient Position: Sitting, Cuff Size: Normal)   Pulse 77   Ht 5\' 7"  (1.702 m)   Wt 164 lb (74.4 kg)   LMP 03/14/2017   BMI 25.69 kg/m  , BMI Body mass index is 25.69 kg/m. GENERAL:  Well appearing HEENT:  Pupils equal round and reactive, fundi not visualized, oral mucosa unremarkable NECK:  No jugular venous distention, waveform within normal limits, carotid upstroke brisk and symmetric, no bruits, no thyromegaly LYMPHATICS:  No cervical, inguinal adenopathy LUNGS:  Clear to auscultation bilaterally BACK:  No CVA tenderness CHEST:  Unremarkable HEART:  PMI not displaced or sustained,S1 and S2 within normal limits, no S3, no S4, no clicks, no rubs, soft non-peaking apical only systolic murmur, no diastolic murmurs ABD:  Flat, positive bowel sounds normal in frequency in pitch, no bruits, no rebound, no guarding, no midline pulsatile mass, no hepatomegaly, no splenomegaly, gravid EXT:  2 plus pulses throughout, no edema,  no cyanosis no clubbing SKIN:  No rashes no nodules NEURO:  Cranial nerves II through XII grossly intact, motor grossly intact throughout PSYCH:  Cognitively intact, oriented to person place and time    EKG:  EKG is ordered today. The ekg ordered today demonstrates sinus rhythm, rate 77, axis within normal limits, intervals within normal limits, no acute ST-T wave changes.   Recent Labs: 05/01/2017: TSH 1.20    Lipid Panel    Component Value Date/Time   CHOL 178 07/21/2015 0942   TRIG 79.0 07/21/2015 0942   HDL 45.10 07/21/2015 0942   CHOLHDL 4 07/21/2015 0942   VLDL 15.8 07/21/2015 0942   LDLCALC 117 (H)  07/21/2015 0942      Wt Readings from Last 3 Encounters:  12/12/17 164 lb (74.4 kg)  05/16/17 149 lb (67.6 kg)  05/01/17 151 lb 8 oz (68.7 kg)      Other studies Reviewed: Additional studies/ records that were reviewed today include: Office records. . Review of the above records demonstrates:  Please see elsewhere in the note.     ASSESSMENT AND PLAN:  PALPITATIONS:  I will check an echo and labs TSH, BMET and Mg.  Provided these are normal she would be comfortable with reassurance and no further testing.  She would not want treatment unless they became more symptomatic.  At that point we could consider a beta-blocker.  MURMUR: She has a soft systolic murmur.  I suspect this is a flow murmur but will check with the echo as above.   Current medicines are reviewed at length with the patient today.  The patient does not have concerns regarding medicines.  The following changes have been made:  no change  Labs/ tests ordered today include:   Orders Placed This Encounter  Procedures  . TSH  . Basic Metabolic Panel (BMET)  . Magnesium  . EKG 12-Lead  . ECHOCARDIOGRAM COMPLETE     Disposition:   FU with me as needed.     Signed, Minus Breeding, MD  12/12/2017 4:40 PM    [facility]

## 2017-12-12 ENCOUNTER — Encounter: Payer: Self-pay | Admitting: Cardiology

## 2017-12-12 ENCOUNTER — Ambulatory Visit: Payer: 59 | Admitting: Cardiology

## 2017-12-12 VITALS — BP 122/70 | HR 77 | Ht 67.0 in | Wt 164.0 lb

## 2017-12-12 DIAGNOSIS — R5383 Other fatigue: Secondary | ICD-10-CM | POA: Diagnosis not present

## 2017-12-12 DIAGNOSIS — Z79899 Other long term (current) drug therapy: Secondary | ICD-10-CM | POA: Diagnosis not present

## 2017-12-12 DIAGNOSIS — R002 Palpitations: Secondary | ICD-10-CM | POA: Diagnosis not present

## 2017-12-12 DIAGNOSIS — R011 Cardiac murmur, unspecified: Secondary | ICD-10-CM | POA: Diagnosis not present

## 2017-12-12 NOTE — Patient Instructions (Signed)
Medication Instructions:  Continue current medications  If you need a refill on your cardiac medications before your next appointment, please call your pharmacy.  Labwork: TSH, BMP, Magnesium HERE IN OUR OFFICE AT [facility]  Take the provided lab slips for you to take with you to the lab for you blood draw.   You will NOT need to fast   Testing/Procedures: Your physician has requested that you have an echocardiogram. Echocardiography is a painless test that uses sound waves to create images of your heart. It provides your doctor with information about the size and shape of your heart and how well your heart's chambers and valves are working. This procedure takes approximately one hour. There are no restrictions for this procedure.  Follow-Up: Your physician wants you to follow-up in: As Needed.     Thank you for choosing [facility] at [facility]!!

## 2017-12-20 ENCOUNTER — Telehealth (HOSPITAL_COMMUNITY): Payer: Self-pay | Admitting: Cardiology

## 2017-12-23 ENCOUNTER — Other Ambulatory Visit (HOSPITAL_COMMUNITY): Payer: 59

## 2017-12-24 NOTE — Telephone Encounter (Signed)
User: Cherie Dark A Date/time: 12/20/17 2:31 PM  Comment: Called pt and lmsg for her to CB to r/s echo on 3/18 due to the tech being out sick.Vassie Moment  Context:  Outcome: Left Message  Phone number: [phone] Phone Type: Home Phone  Comm. type: Telephone Call type: Outgoing  Contact: Melina Fiddler Relation to patient: Self

## 2017-12-25 ENCOUNTER — Other Ambulatory Visit: Payer: Self-pay

## 2017-12-25 ENCOUNTER — Ambulatory Visit (HOSPITAL_COMMUNITY): Payer: 59 | Attending: Cardiology

## 2017-12-25 DIAGNOSIS — Z3A26 26 weeks gestation of pregnancy: Secondary | ICD-10-CM | POA: Diagnosis not present

## 2017-12-25 DIAGNOSIS — R011 Cardiac murmur, unspecified: Secondary | ICD-10-CM | POA: Insufficient documentation

## 2017-12-25 DIAGNOSIS — O99412 Diseases of the circulatory system complicating pregnancy, second trimester: Secondary | ICD-10-CM | POA: Diagnosis present

## 2017-12-25 DIAGNOSIS — R002 Palpitations: Secondary | ICD-10-CM | POA: Diagnosis not present

## 2018-01-20 ENCOUNTER — Inpatient Hospital Stay (HOSPITAL_COMMUNITY)
Admission: AD | Admit: 2018-01-20 | Discharge: 2018-01-20 | Disposition: A | Discharge status: Home / Self Care | Payer: 59 | Source: Ambulatory Visit | Attending: Obstetrics & Gynecology | Admitting: Obstetrics & Gynecology

## 2018-01-20 ENCOUNTER — Encounter (HOSPITAL_COMMUNITY): Payer: Self-pay

## 2018-01-20 DIAGNOSIS — W19XXXA Unspecified fall, initial encounter: Secondary | ICD-10-CM | POA: Diagnosis not present

## 2018-01-20 DIAGNOSIS — W1781XA Fall down embankment (hill), initial encounter: Secondary | ICD-10-CM | POA: Diagnosis not present

## 2018-01-20 DIAGNOSIS — Z3A32 32 weeks gestation of pregnancy: Secondary | ICD-10-CM

## 2018-01-20 DIAGNOSIS — Y9302 Activity, running: Secondary | ICD-10-CM | POA: Insufficient documentation

## 2018-01-20 DIAGNOSIS — F411 Generalized anxiety disorder: Secondary | ICD-10-CM | POA: Insufficient documentation

## 2018-01-20 DIAGNOSIS — O99343 Other mental disorders complicating pregnancy, third trimester: Secondary | ICD-10-CM | POA: Insufficient documentation

## 2018-01-20 DIAGNOSIS — Z8249 Family history of ischemic heart disease and other diseases of the circulatory system: Secondary | ICD-10-CM | POA: Insufficient documentation

## 2018-01-20 DIAGNOSIS — O9A213 Injury, poisoning and certain other consequences of external causes complicating pregnancy, third trimester: Secondary | ICD-10-CM | POA: Insufficient documentation

## 2018-01-20 DIAGNOSIS — Z818 Family history of other mental and behavioral disorders: Secondary | ICD-10-CM | POA: Diagnosis not present

## 2018-01-20 DIAGNOSIS — F329 Major depressive disorder, single episode, unspecified: Secondary | ICD-10-CM | POA: Diagnosis not present

## 2018-01-20 DIAGNOSIS — S79912A Unspecified injury of left hip, initial encounter: Secondary | ICD-10-CM | POA: Diagnosis not present

## 2018-01-20 DIAGNOSIS — M25552 Pain in left hip: Secondary | ICD-10-CM | POA: Insufficient documentation

## 2018-01-20 DIAGNOSIS — Z79899 Other long term (current) drug therapy: Secondary | ICD-10-CM | POA: Insufficient documentation

## 2018-01-20 LAB — FIBRINOGEN: Fibrinogen: 419 mg/dL (ref 210–475)
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
## 2018-01-20 NOTE — Progress Notes (Signed)
Discharge instructions given, questions answered, pt states understanding, signs and given copy.

## 2018-01-20 NOTE — Discharge Instructions (Signed)
Please call your doctor's office if you have any bleeding like a period, more than 6 painful and strong contractions in 1 hour or the baby stops moving like normal.

## 2018-01-20 NOTE — MAU Note (Signed)
Pt today at 10am. She cut her toe, but no other complaints. Pt called the ob office and they recommended her to come to hospital to get checked out

## 2018-01-23 ENCOUNTER — Other Ambulatory Visit: Payer: Self-pay

## 2018-01-23 ENCOUNTER — Inpatient Hospital Stay (HOSPITAL_COMMUNITY)
Admission: AD | Admit: 2018-01-23 | Discharge: 2018-01-23 | Disposition: A | Discharge status: Home / Self Care | Payer: 59 | Source: Ambulatory Visit | Attending: Obstetrics and Gynecology | Admitting: Obstetrics and Gynecology

## 2018-01-23 ENCOUNTER — Encounter (HOSPITAL_COMMUNITY): Payer: Self-pay

## 2018-01-23 DIAGNOSIS — F329 Major depressive disorder, single episode, unspecified: Secondary | ICD-10-CM | POA: Diagnosis not present

## 2018-01-23 DIAGNOSIS — O26853 Spotting complicating pregnancy, third trimester: Secondary | ICD-10-CM

## 2018-01-23 DIAGNOSIS — F411 Generalized anxiety disorder: Secondary | ICD-10-CM | POA: Insufficient documentation

## 2018-01-23 DIAGNOSIS — Z3A32 32 weeks gestation of pregnancy: Secondary | ICD-10-CM

## 2018-01-23 DIAGNOSIS — N898 Other specified noninflammatory disorders of vagina: Secondary | ICD-10-CM | POA: Insufficient documentation

## 2018-01-23 DIAGNOSIS — O99343 Other mental disorders complicating pregnancy, third trimester: Secondary | ICD-10-CM | POA: Insufficient documentation

## 2018-01-23 DIAGNOSIS — O26893 Other specified pregnancy related conditions, third trimester: Secondary | ICD-10-CM | POA: Insufficient documentation

## 2018-01-23 DIAGNOSIS — Z0371 Encounter for suspected problem with amniotic cavity and membrane ruled out: Secondary | ICD-10-CM

## 2018-01-23 LAB — URINALYSIS, ROUTINE W REFLEX MICROSCOPIC
Bilirubin Urine: NEGATIVE
Glucose, UA: 50 mg/dL — AB
Ketones, ur: NEGATIVE mg/dL
Nitrite: NEGATIVE
Protein, ur: NEGATIVE mg/dL
Specific Gravity, Urine: 1.008 (ref 1.005–1.030)
pH: 7 (ref 5.0–8.0)

## 2018-01-23 LAB — AMNISURE RUPTURE OF MEMBRANE (ROM) NOT AT ARMC: Amnisure ROM: NEGATIVE

## 2018-01-23 NOTE — MAU Provider Note (Signed)
Chief Complaint:  Rupture of Membranes   First Provider Initiated Contact with Patient 01/23/18 1256      HPI: Laura Gamble is a 27 y.o. G2P1001 at [redacted]w[redacted]d who presents to maternity admissions reporting she was walking and pushing her daughter in a stroller and she felt a gush of fluid that made her underwear wet. It was associated with some intermittent cramping which has now resolved. She walked back home and went to the bathroom, noticing some light red when wiping and in her underwear.  She fell 3 days ago and was evaluated in MAU so is worried that this could be related. She denies any further leakage of fluid or bleeding.  There are no other associated symptoms. She has not tried any treatments. She reports good fetal movement, denies vaginal itching/burning, urinary symptoms, h/a, dizziness, n/v, or fever/chills.    HPI  Past Medical History: Past Medical History:  Diagnosis Date  . Depression 07/01/2013  . Generalized anxiety disorder 04/28/2013  . Panic attack     Past obstetric history: OB History  Gravida Para Term Preterm AB Living  2 1 1     1   SAB TAB Ectopic Multiple Live Births        0 1    # Outcome Date GA Lbr Len/2nd Weight Sex Delivery Anes PTL Lv  2 Current           1 Term 04/11/16 [redacted]w[redacted]d 18:00 / 00:25 7 lb 2.5 oz (3.246 kg) F Vag-Spont EPI  LIV    Past Surgical History: Past Surgical History:  Procedure Laterality Date  . NO PAST SURGERIES      Family History: Family History  Problem Relation Age of Onset  . Anxiety disorder Mother   . Hypertension Mother   . Hypertension Maternal Grandfather     Social History: Social History   Tobacco Use  . Smoking status: Never Smoker  . Smokeless tobacco: Never Used  Substance Use Topics  . Alcohol use: No  . Drug use: No    Allergies: No Known Allergies  Meds:  No medications prior to admission.    ROS:  Review of Systems  Constitutional: Negative for chills, fatigue and fever.  Eyes:  Negative for visual disturbance.  Respiratory: Negative for shortness of breath.   Cardiovascular: Negative for chest pain.  Gastrointestinal: Negative for abdominal pain, nausea and vomiting.  Genitourinary: Positive for vaginal bleeding and vaginal discharge. Negative for difficulty urinating, dysuria, flank pain, pelvic pain and vaginal pain.  Neurological: Negative for dizziness and headaches.  Psychiatric/Behavioral: Negative.      I have reviewed patient's Past Medical Hx, Surgical Hx, Family Hx, Social Hx, medications and allergies.   Physical Exam   Patient Vitals for the past 24 hrs:  BP Temp Temp src Pulse Resp SpO2 Height Weight  01/23/18 1446 119/71 - - - - - - -  01/23/18 1337 128/72 - - 90 - - - -  01/23/18 1226 (!) 142/88 98 F (36.7 C) Oral (!) 130 18 100 % - -  01/23/18 1213 - - - - - - 5\' 6"  (1.676 m) 173 lb (78.5 kg)   Constitutional: Well-developed, well-nourished female in mild distress.  Cardiovascular: normal rate Respiratory: normal effort GI: Abd soft, non-tender, gravid appropriate for gestational age.  MS: Extremities nontender, no edema, normal ROM Neurologic: Alert and oriented x 4.  GU: Neg CVAT.  PELVIC EXAM: Cervix pink, visually closed, without lesion, scant white creamy discharge, no bleeding visualized, vaginal  walls and external genitalia normal  Dilation: Closed Effacement (%): Thick Cervical Position: Posterior Exam by:: Leftwich-Kirby, Siddhant Hashemi, CNM  FHT:  Baseline 130 , moderate variability, accelerations present, no decelerations Contractions: None on toco or to palpation   Labs: Results for orders placed or performed during the hospital encounter of 01/23/18 (from the past 24 hour(s))  Urinalysis, Routine w reflex microscopic     Status: Abnormal   Collection Time: 01/23/18 12:09 PM  Result Value Ref Range   Color, Urine YELLOW YELLOW   APPearance HAZY (A) CLEAR   Specific Gravity, Urine 1.008 1.005 - 1.030   pH 7.0 5.0 - 8.0    Glucose, UA 50 (A) NEGATIVE mg/dL   Hgb urine dipstick SMALL (A) NEGATIVE   Bilirubin Urine NEGATIVE NEGATIVE   Ketones, ur NEGATIVE NEGATIVE mg/dL   Protein, ur NEGATIVE NEGATIVE mg/dL   Nitrite NEGATIVE NEGATIVE   Leukocytes, UA SMALL (A) NEGATIVE   RBC / HPF 0-5 0 - 5 RBC/hpf   WBC, UA 0-5 0 - 5 WBC/hpf   Bacteria, UA FEW (A) NONE SEEN   Squamous Epithelial / LPF 6-30 (A) NONE SEEN   Mucus PRESENT   Amnisure rupture of membrane (rom)not at [facility]     Status: None   Collection Time: 01/23/18 12:49 PM  Result Value Ref Range   Amnisure ROM NEGATIVE       Imaging:  No results found.  MAU Course/MDM: I have ordered labs and reviewed results.  NST reviewed and reactive No evidence of bleeding on exam No evidence of rupture of membranes or preterm labor Likely bleeding from cervix related to activity/walking today Consult Adkins with presentation, exam findings and test results.  F/U in office Pt discharge with strict return/preterm labor precautions.   Assessment: 1. Encounter for suspected premature rupture of amniotic membranes, with rupture of membranes not found   2. Spotting affecting pregnancy in third trimester     Plan: Discharge home Labor precautions and fetal kick counts Follow-up Information    Marylynn Pearson, MD Follow up.   Specialty:  Obstetrics and Gynecology Why:  As scheduled, return to MAU as needed for emergencies Contact information: [address], [place] [address] [phone]          Allergies as of 01/23/2018   No Known Allergies     Medication List    TAKE these medications   FLUoxetine 20 MG tablet Commonly known as:  PROZAC Take 20 mg by mouth daily.   prenatal multivitamin Tabs tablet Take 1 tablet by mouth daily at 12 noon.       Fatima Blank Certified Nurse-Midwife 01/23/2018 8:56 PM

## 2018-01-23 NOTE — MAU Note (Addendum)
Pt presents to MAU with c/o PROM. Pt was walking today and felt leaking of fluid around 11 am. Pt had bright red bleeding to follow the leaking of fluid that was minimal and has had abdominal pain. Pt did fall on Monday, was seen here but discharged home. +FM

## 2018-02-27 ENCOUNTER — Inpatient Hospital Stay (HOSPITAL_COMMUNITY)
Admission: AD | Admit: 2018-02-27 | Discharge: 2018-02-27 | Disposition: A | Discharge status: Home / Self Care | Payer: 59 | Source: Ambulatory Visit | Attending: Obstetrics and Gynecology | Admitting: Obstetrics and Gynecology

## 2018-02-27 ENCOUNTER — Encounter (HOSPITAL_COMMUNITY): Payer: Self-pay | Admitting: *Deleted

## 2018-02-27 ENCOUNTER — Other Ambulatory Visit: Payer: Self-pay

## 2018-02-27 DIAGNOSIS — O9A213 Injury, poisoning and certain other consequences of external causes complicating pregnancy, third trimester: Secondary | ICD-10-CM

## 2018-02-27 DIAGNOSIS — O26893 Other specified pregnancy related conditions, third trimester: Secondary | ICD-10-CM | POA: Insufficient documentation

## 2018-02-27 DIAGNOSIS — N898 Other specified noninflammatory disorders of vagina: Secondary | ICD-10-CM | POA: Diagnosis not present

## 2018-02-27 DIAGNOSIS — Z3689 Encounter for other specified antenatal screening: Secondary | ICD-10-CM

## 2018-02-27 DIAGNOSIS — Z3A37 37 weeks gestation of pregnancy: Secondary | ICD-10-CM | POA: Diagnosis not present

## 2018-02-27 LAB — POCT FERN TEST: POCT Fern Test: NEGATIVE

## 2018-02-27 LAB — AMNISURE RUPTURE OF MEMBRANE (ROM) NOT AT ARMC: Amnisure ROM: NEGATIVE

## 2018-02-27 NOTE — MAU Provider Note (Signed)
History   [id number]   Chief Complaint  Patient presents with  . Contractions  . Rupture of Membranes    HPI Laura Gamble is a 27 y.o. female  G2P1001 @37 .6 wks here with report of leaking clear fluid around 0600 today.  No gush of fluid. Leaking of fluid has not continued. Pt reports contractions q6 min. She denies vaginal bleeding. Last intercourse was 2 days ago. She reports good fetal movement. No HA, visual disturbances, or epigastric pain. All other systems negative.    Patient's last menstrual period was 03/14/2017.  OB History  Gravida Para Term Preterm AB Living  2 1 1     1   SAB TAB Ectopic Multiple Live Births        0 1    # Outcome Date GA Lbr Len/2nd Weight Sex Delivery Anes PTL Lv  2 Current           1 Term 04/11/16 [redacted]w[redacted]d 18:00 / 00:25 7 lb 2.5 oz (3.246 kg) F Vag-Spont EPI  LIV    Past Medical History:  Diagnosis Date  . Depression 07/01/2013  . Generalized anxiety disorder 04/28/2013  . Panic attack     Family History  Problem Relation Age of Onset  . Anxiety disorder Mother   . Hypertension Mother   . Depression Mother   . Hypertension Maternal Grandfather     Social History   Socioeconomic History  . Marital status: Married    Spouse name: Not on file  . Number of children: Not on file  . Years of education: Not on file  . Highest education level: Not on file  Occupational History  . Not on file  Social Needs  . Financial resource strain: Not on file  . Food insecurity:    Worry: Not on file    Inability: Not on file  . Transportation needs:    Medical: Not on file    Non-medical: Not on file  Tobacco Use  . Smoking status: Never Smoker  . Smokeless tobacco: Never Used  Substance and Sexual Activity  . Alcohol use: No  . Drug use: No  . Sexual activity: Yes    Birth control/protection: None  Lifestyle  . Physical activity:    Days per week: Not on file    Minutes per session: Not on file  . Stress: Not on file   Relationships  . Social connections:    Talks on phone: Not on file    Gets together: Not on file    Attends religious service: Not on file    Active member of club or organization: Not on file    Attends meetings of clubs or organizations: Not on file    Relationship status: Not on file  Other Topics Concern  . Not on file  Social History Narrative   student    No Known Allergies  No current facility-administered medications on file prior to encounter.    Current Outpatient Medications on File Prior to Encounter  Medication Sig Dispense Refill  . FLUoxetine (PROZAC) 20 MG tablet Take 20 mg by mouth daily.    . Prenatal Vit-Fe Fumarate-FA (PRENATAL MULTIVITAMIN) TABS tablet Take 1 tablet by mouth daily at 12 noon.       Review of Systems  Gastrointestinal: Positive for abdominal pain (ctx).  Genitourinary: Positive for vaginal discharge. Negative for vaginal bleeding.     Physical Exam   Vitals:   02/27/18 0830 02/27/18 0831 02/27/18 0845 02/27/18 0900  BP:  137/72 140/87 (!) 142/82  Pulse:  92 89 93  Resp:      Temp:      TempSrc:      SpO2: 100%  99% 100%  Weight:      Height:        Physical Exam  Constitutional: She is oriented to person, place, and time. She appears well-developed and well-nourished. No distress.  HENT:  Head: Normocephalic and atraumatic.  Neck: Normal range of motion.  Respiratory: Effort normal. No respiratory distress.  GI: Soft.  Genitourinary:  Genitourinary Comments: SSE: no pool, fern neg   Musculoskeletal: Normal range of motion.  Neurological: She is alert and oriented to person, place, and time.  Psychiatric: She has a normal mood and affect.  EFM: 135 bpm, mod variability, + accels, no decels Toco: irregular  Results for orders placed or performed during the hospital encounter of 02/27/18 (from the past 24 hour(s))  POCT fern test     Status: None   Collection Time: 02/27/18  9:29 AM  Result Value Ref Range   POCT  Fern Test Negative = intact amniotic membranes   Amnisure rupture of membrane (rom)not at [facility]     Status: None   Collection Time: 02/27/18  9:37 AM  Result Value Ref Range   Amnisure ROM NEGATIVE    MAU Course  Procedures  MDM Labs ordered and reviewed. No evidence of SROM or labor. BP mildly elevated. Pt asymptomatic. RN to notify Dr. Matthew Saras for mngt.  Assessment and Plan   1. [redacted] weeks gestation of pregnancy   2. Traumatic injury during pregnancy in third trimester   3. NST (non-stress test) reactive   4. Vaginal discharge during pregnancy in third trimester    Discharge home per Dr. Matthew Saras Follow up in [facility] office tomorrow   Julianne Handler, [place] 02/27/2018 9:17 AM

## 2018-02-27 NOTE — MAU Note (Signed)
Urine in lab 

## 2018-02-27 NOTE — MAU Note (Signed)
Pt presents with c/o LOF since 0600, clear and ctxs. Denies VB.  Reports +FM.

## 2018-02-27 NOTE — Progress Notes (Signed)
Pt sitting up, fetus not monitoring d/t maternal position.

## 2018-02-27 NOTE — MAU Note (Signed)
I have communicated with Dr. Matthew Saras and reviewed vital signs:  Vitals:   02/27/18 1026 02/27/18 1027  BP:  120/68  Pulse:  89  Resp:  20  Temp:  98.5 F (36.9 C)  SpO2: 100%     Vaginal exam:  Dilation: Fingertip Effacement (%): Thick Station: -3 Presentation: Vertex Exam by:: F. Cherokee Clowers, RNC,   Also reviewed contraction pattern and that non-stress test is reactive.  It has been documented that patient is contracting irregularly with cervical exam of FT/1cm,  not indicating active labor.  Patient denies any other complaints.  Based on this report provider has given order for discharge.  A discharge order and diagnosis entered by a provider.   Labor discharge instructions reviewed with patient.

## 2018-02-27 NOTE — Discharge Instructions (Signed)

## 2018-02-28 ENCOUNTER — Inpatient Hospital Stay (HOSPITAL_COMMUNITY)
Admission: AD | Admit: 2018-02-28 | Discharge: 2018-02-28 | Disposition: A | Discharge status: Home / Self Care | Payer: 59 | Source: Ambulatory Visit | Attending: Obstetrics and Gynecology | Admitting: Obstetrics and Gynecology

## 2018-02-28 ENCOUNTER — Encounter (HOSPITAL_COMMUNITY): Payer: Self-pay

## 2018-02-28 DIAGNOSIS — R519 Headache, unspecified: Secondary | ICD-10-CM

## 2018-02-28 DIAGNOSIS — N898 Other specified noninflammatory disorders of vagina: Secondary | ICD-10-CM | POA: Diagnosis not present

## 2018-02-28 DIAGNOSIS — Z3A37 37 weeks gestation of pregnancy: Secondary | ICD-10-CM | POA: Diagnosis not present

## 2018-02-28 DIAGNOSIS — O9A213 Injury, poisoning and certain other consequences of external causes complicating pregnancy, third trimester: Secondary | ICD-10-CM

## 2018-02-28 DIAGNOSIS — O133 Gestational [pregnancy-induced] hypertension without significant proteinuria, third trimester: Secondary | ICD-10-CM

## 2018-02-28 DIAGNOSIS — R51 Headache: Secondary | ICD-10-CM | POA: Diagnosis not present

## 2018-02-28 DIAGNOSIS — O219 Vomiting of pregnancy, unspecified: Secondary | ICD-10-CM

## 2018-02-28 DIAGNOSIS — R030 Elevated blood-pressure reading, without diagnosis of hypertension: Secondary | ICD-10-CM | POA: Diagnosis present

## 2018-02-28 DIAGNOSIS — Z3A38 38 weeks gestation of pregnancy: Secondary | ICD-10-CM | POA: Diagnosis not present

## 2018-02-28 DIAGNOSIS — O26893 Other specified pregnancy related conditions, third trimester: Secondary | ICD-10-CM

## 2018-02-28 LAB — COMPREHENSIVE METABOLIC PANEL
ALT: 14 U/L (ref 14–54)
AST: 22 U/L (ref 15–41)
Albumin: 3 g/dL — ABNORMAL LOW (ref 3.5–5.0)
Alkaline Phosphatase: 110 U/L (ref 38–126)
Anion gap: 10 (ref 5–15)
BUN: <5 mg/dL — ABNORMAL LOW (ref 6–20)
CO2: 20 mmol/L — ABNORMAL LOW (ref 22–32)
Calcium: 8.7 mg/dL — ABNORMAL LOW (ref 8.9–10.3)
Chloride: 105 mmol/L (ref 101–111)
Creatinine, Ser: 0.41 mg/dL — ABNORMAL LOW (ref 0.44–1.00)
GFR calc Af Amer: >60 mL/min (ref >60)
GFR calc non Af Amer: >60 mL/min (ref >60)
Glucose, Bld: 122 mg/dL — ABNORMAL HIGH (ref 65–99)
Potassium: 3.1 mmol/L — ABNORMAL LOW (ref 3.5–5.1)
Sodium: 135 mmol/L (ref 135–145)
Total Bilirubin: 0.6 mg/dL (ref 0.3–1.2)
Total Protein: 6.9 g/dL (ref 6.5–8.1)

## 2018-02-28 LAB — CBC
HCT: 36.1 % (ref 36.0–46.0)
Hemoglobin: 12.6 g/dL (ref 12.0–15.0)
MCH: 30.7 pg (ref 26.0–34.0)
MCHC: 34.9 g/dL (ref 30.0–36.0)
MCV: 88 fL (ref 78.0–100.0)
Platelets: 215 10*3/uL (ref 150–400)
RBC: 4.1 MIL/uL (ref 3.87–5.11)
RDW: 12.9 % (ref 11.5–15.5)
WBC: 11.8 10*3/uL — ABNORMAL HIGH (ref 4.0–10.5)

## 2018-02-28 LAB — PROTEIN / CREATININE RATIO, URINE
Creatinine, Urine: 127 mg/dL
Protein Creatinine Ratio: 0.22 mg/mg{Cre} — ABNORMAL HIGH (ref 0.00–0.15)
Total Protein, Urine: 28 mg/dL

## 2018-02-28 MED ORDER — ONDANSETRON 8 MG PO TBDP
8.0000 mg | ORAL_TABLET | Freq: Once | ORAL | Status: AC
  Administered 2018-02-28: 8 mg via ORAL
  Filled 2018-02-28: qty 1

## 2018-02-28 MED ORDER — ACETAMINOPHEN 500 MG PO TABS
1000.0000 mg | ORAL_TABLET | Freq: Once | ORAL | Status: AC
  Administered 2018-02-28: 1000 mg via ORAL
  Filled 2018-02-28: qty 2

## 2018-02-28 NOTE — MAU Note (Signed)
PT SAYS SHE WAS HERE  THIS AM  FOR LABOR-   1 CM.  D/C HOME . BP WAS ELEVATED.    UC'S  STRONGER TONIGHT.   HER MOM  TOOK HER BP - 174/118.      FEELS NAUSEA

## 2018-02-28 NOTE — MAU Provider Note (Addendum)
Chief Complaint:  Contractions; Headache; and Hypertension   None     HPI: Laura Gamble is a 27 y.o. G2P1001 at 52w0dwho presents to maternity admissions reporting contractions, headache, high blood pressure, and nausea.  She reports cramping/contractions starting early this morning and worsening all day.  The pain is low in her abdomen, intermittent, and cramping pain.  She also reports intermittent h/a and nausea associated with her abdominal pain. She also reports 8+ bowel movements in 24 hours.  She denies any sick contacts. She is tolerating PO fluids but reports she is so nauseous it is hard to sleep. She woke up at 11:30 pm and reports she felt terrible so her mother took her BP on a home cuff and it was 170/100.  She has not tried any treatments. There are no other associated symptoms.   She reports good fetal movement, denies LOF, vaginal bleeding, vaginal itching/burning, urinary symptoms, h/a, dizziness, n/v, or fever/chills.    HPI  Past Medical History: Past Medical History:  Diagnosis Date  . Depression 07/01/2013  . Generalized anxiety disorder 04/28/2013  . Panic attack     Past obstetric history: OB History  Gravida Para Term Preterm AB Living  '2 1 1     1  ' SAB TAB Ectopic Multiple Live Births        0 1    # Outcome Date GA Lbr Len/2nd Weight Sex Delivery Anes PTL Lv  2 Current           1 Term 04/11/16 38w3d8:00 / 00:25 7 lb 2.5 oz (3.246 kg) F Vag-Spont EPI  LIV    Past Surgical History: Past Surgical History:  Procedure Laterality Date  . NO PAST SURGERIES      Family History: Family History  Problem Relation Age of Onset  . Anxiety disorder Mother   . Hypertension Mother   . Depression Mother   . Hypertension Maternal Grandfather     Social History: Social History   Tobacco Use  . Smoking status: Never Smoker  . Smokeless tobacco: Never Used  Substance Use Topics  . Alcohol use: No  . Drug use: No    Allergies: No Known  Allergies  Meds:  Medications Prior to Admission  Medication Sig Dispense Refill Last Dose  . FLUoxetine (PROZAC) 20 MG tablet Take 20 mg by mouth daily.   02/27/2018 at Unknown time  . Prenatal Vit-Fe Fumarate-FA (PRENATAL MULTIVITAMIN) TABS tablet Take 1 tablet by mouth daily at 12 noon.   02/27/2018 at Unknown time    ROS:  Review of Systems  Constitutional: Negative for chills, fatigue and fever.  Eyes: Negative for visual disturbance.  Respiratory: Negative for shortness of breath.   Cardiovascular: Negative for chest pain.  Gastrointestinal: Positive for nausea and vomiting. Negative for abdominal pain and diarrhea.       Frequent loose stools   Genitourinary: Negative for difficulty urinating, dysuria, flank pain, pelvic pain, vaginal bleeding, vaginal discharge and vaginal pain.  Neurological: Positive for headaches. Negative for dizziness.  Psychiatric/Behavioral: Negative.      I have reviewed patient's Past Medical Hx, Surgical Hx, Family Hx, Social Hx, medications and allergies.   Physical Exam   Patient Vitals for the past 24 hrs:  BP Temp Temp src Pulse Resp Height Weight  02/28/18 0131 130/71 - - 87 - - -  02/28/18 0113 135/76 - - 86 - - -  02/28/18 0048 133/73 98.6 F (37 C) Oral 81 20 '5\' 6"'  (  1.676 m) 180 lb 12 oz (82 kg)   Constitutional: Well-developed, well-nourished female in no acute distress.  Cardiovascular: normal rate Respiratory: normal effort GI: Abd soft, non-tender, gravid appropriate for gestational age.  MS: Extremities nontender, no edema, normal ROM Neurologic: Alert and oriented x 4.  GU: Neg CVAT.   Dilation: 1 Effacement (%): Thick Cervical Position: Posterior Station: -3 Presentation: Vertex Exam by:: Maryagnes Amos RN  FHT:  Baseline 135 , moderate variability, accelerations present, no decelerations Contractions: q 2-5 mins, mild to moderate to palpation   Labs:     Results for orders placed or performed during the  hospital encounter of 02/28/18 (from the past 48 hour(s))  Protein / creatinine ratio, urine     Status: Abnormal   Collection Time: 02/28/18 12:51 AM  Result Value Ref Range   Creatinine, Urine 127.00 mg/dL   Total Protein, Urine 28 mg/dL    Comment: NO NORMAL RANGE ESTABLISHED FOR THIS TEST   Protein Creatinine Ratio 0.22 (H) 0.00 - 0.15 mg/mg[Cre]    Comment: Performed at [facility], [address] [place], [place] [address]  CBC     Status: Abnormal   Collection Time: 02/28/18  1:32 AM  Result Value Ref Range   WBC 11.8 (H) 4.0 - 10.5 K/uL   RBC 4.10 3.87 - 5.11 MIL/uL   Hemoglobin 12.6 12.0 - 15.0 g/dL   HCT 36.1 36.0 - 46.0 %   MCV 88.0 78.0 - 100.0 fL   MCH 30.7 26.0 - 34.0 pg   MCHC 34.9 30.0 - 36.0 g/dL   RDW 12.9 11.5 - 15.5 %   Platelets 215 150 - 400 K/uL    Comment: Performed at [facility], [address], [place], [place] [address]  Comprehensive metabolic panel     Status: Abnormal   Collection Time: 02/28/18  1:32 AM  Result Value Ref Range   Sodium 135 135 - 145 mmol/L   Potassium 3.1 (L) 3.5 - 5.1 mmol/L   Chloride 105 101 - 111 mmol/L   CO2 20 (L) 22 - 32 mmol/L   Glucose, Bld 122 (H) 65 - 99 mg/dL   BUN <5 (L) 6 - 20 mg/dL    Comment: REPEATED TO VERIFY   Creatinine, Ser 0.41 (L) 0.44 - 1.00 mg/dL   Calcium 8.7 (L) 8.9 - 10.3 mg/dL   Total Protein 6.9 6.5 - 8.1 g/dL   Albumin 3.0 (L) 3.5 - 5.0 g/dL   AST 22 15 - 41 U/L   ALT 14 14 - 54 U/L   Alkaline Phosphatase 110 38 - 126 U/L   Total Bilirubin 0.6 0.3 - 1.2 mg/dL   GFR calc non Af Amer >60 >60 mL/min   GFR calc Af Amer >60 >60 mL/min    Comment: (NOTE) The eGFR has been calculated using the CKD EPI equation. This calculation has not been validated in all clinical situations. eGFR's persistently <60 mL/min signify possible Chronic Kidney Disease.    Anion gap 10 5 - 15    Comment: Performed at [facility], [address], [place], [place] [address]    Imaging:  No results  found.  MAU Course/MDM: Pt normotensive in MAU but with elevated BP at home and presentation with h/a, preeclampsia labs ordered.  Plts, liver enzymes, and creatinine wnl. P/C ratio is 0.22, also wnl. NST reviewed and reactive Cervix unchanged from office visit and rare contractions on toco/to palpation so no signs of active labor Tylenol 1000 mg PO and Zofran  8 mg ODT given in MAU with complete resolution of pt symptoms Consult Dr Matthew Saras with presentation, exam findings and test results.  D/C home, pt to keep appt tomorrow in office for BP check Return to MAU with signs of labor or emergencies Pt discharge with strict labor/preeclampsia precautions.    Assessment: 1. Headache in pregnancy, antepartum, third trimester   2. Nausea and vomiting during pregnancy   3. Transient hypertension of pregnancy in third trimester     Plan: Discharge home Labor precautions and fetal kick counts  Allergies as of 02/28/2018   No Known Allergies     Medication List    TAKE these medications   FLUoxetine 20 MG tablet Commonly known as:  PROZAC Take 20 mg by mouth daily.   prenatal multivitamin Tabs tablet Take 1 tablet by mouth daily at 12 noon.       Fatima Blank Certified Nurse-Midwife 02/28/2018 1:41 AM

## 2018-02-28 NOTE — MAU Note (Signed)
Pt reports stronger, more frequent contractions. Was here this morning and was 1cm. States contractions are about every 7-8 mins apart. Reports contractions 5/10. Pt  Reports that her BP was 175/118 at home. States she took her BP because she felt "weird" and nauseous and had a HA. Reports HA is now a little better, rates 6/10. Did not take anything. Pt denies vaginal bleeding or LOF. Reports good fetal movement.

## 2018-02-28 NOTE — Discharge Instructions (Signed)

## 2018-03-13 ENCOUNTER — Encounter (HOSPITAL_COMMUNITY): Payer: Self-pay | Admitting: *Deleted

## 2018-03-13 ENCOUNTER — Telehealth (HOSPITAL_COMMUNITY): Payer: Self-pay | Admitting: *Deleted

## 2018-03-13 LAB — OB RESULTS CONSOLE GBS: GBS: NEGATIVE

## 2018-03-13 NOTE — Telephone Encounter (Signed)
Preadmission screen  

## 2018-03-20 ENCOUNTER — Encounter (HOSPITAL_COMMUNITY): Payer: Self-pay

## 2018-03-20 ENCOUNTER — Inpatient Hospital Stay (HOSPITAL_COMMUNITY)
Admission: AD | Admit: 2018-03-20 | Discharge: 2018-03-20 | Disposition: A | Discharge status: Home / Self Care | Payer: 59 | Source: Ambulatory Visit | Attending: Obstetrics and Gynecology | Admitting: Obstetrics and Gynecology

## 2018-03-20 ENCOUNTER — Other Ambulatory Visit: Payer: Self-pay

## 2018-03-20 DIAGNOSIS — O471 False labor at or after 37 completed weeks of gestation: Secondary | ICD-10-CM | POA: Insufficient documentation

## 2018-03-20 DIAGNOSIS — O479 False labor, unspecified: Secondary | ICD-10-CM

## 2018-03-20 DIAGNOSIS — Z3A39 39 weeks gestation of pregnancy: Secondary | ICD-10-CM | POA: Insufficient documentation

## 2018-03-20 DIAGNOSIS — O9A213 Injury, poisoning and certain other consequences of external causes complicating pregnancy, third trimester: Secondary | ICD-10-CM

## 2018-03-20 LAB — URINALYSIS, ROUTINE W REFLEX MICROSCOPIC
Bilirubin Urine: NEGATIVE
Glucose, UA: NEGATIVE mg/dL
Ketones, ur: NEGATIVE mg/dL
Leukocytes, UA: NEGATIVE
Nitrite: NEGATIVE
Protein, ur: NEGATIVE mg/dL
Specific Gravity, Urine: 1.004 — ABNORMAL LOW (ref 1.005–1.030)
pH: 7 (ref 5.0–8.0)

## 2018-03-20 MED ORDER — PROMETHAZINE HCL 25 MG/ML IJ SOLN
12.5000 mg | Freq: Once | INTRAMUSCULAR | Status: AC
  Administered 2018-03-20: 12.5 mg via INTRAMUSCULAR
  Filled 2018-03-20: qty 1

## 2018-03-20 MED ORDER — BUTORPHANOL TARTRATE 1 MG/ML IJ SOLN
1.0000 mg | Freq: Once | INTRAMUSCULAR | Status: AC
  Administered 2018-03-20: 1 mg via INTRAMUSCULAR
  Filled 2018-03-20: qty 1

## 2018-03-20 NOTE — MAU Note (Signed)
I have communicated with Dr. Corinna Capra and reviewed vital signs:  Vitals:   03/20/18 2200 03/20/18 2225  BP: 139/86 138/63  Pulse: 81 70  Resp: 17 17  Temp:    SpO2:      Vaginal exam:  Dilation: 2 Effacement (%): 50 Station: -2 Presentation: Vertex Exam by:: [employer], RN,   Also reviewed contraction pattern and that non-stress test is reactive.  It has been documented that patient is contracting irregularly with no cervical change over 2 hours not indicating active labor.  Patient denies any other complaints.  Based on this report provider has given order for discharge.  A discharge order and diagnosis entered by Hansel Feinstein, CNM.   Labor discharge instructions reviewed with patient.

## 2018-03-20 NOTE — Discharge Instructions (Signed)
Braxton Hicks Contractions °Contractions of the uterus can occur throughout pregnancy, but they are not always a sign that you are in labor. You may have practice contractions called Braxton Hicks contractions. These false labor contractions are sometimes confused with true labor. °What are Braxton Hicks contractions? °Braxton Hicks contractions are tightening movements that occur in the muscles of the uterus before labor. Unlike true labor contractions, these contractions do not result in opening (dilation) and thinning of the cervix. Toward the end of pregnancy (32-34 weeks), Braxton Hicks contractions can happen more often and may become stronger. These contractions are sometimes difficult to tell apart from true labor because they can be very uncomfortable. You should not feel embarrassed if you go to the hospital with false labor. °Sometimes, the only way to tell if you are in true labor is for your health care provider to look for changes in the cervix. The health care provider will do a physical exam and may monitor your contractions. If you are not in true labor, the exam should show that your cervix is not dilating and your water has not broken. °If there are other health problems associated with your pregnancy, it is completely safe for you to be sent home with false labor. You may continue to have Braxton Hicks contractions until you go into true labor. °How to tell the difference between true labor and false labor °True labor °· Contractions last 30-70 seconds. °· Contractions become very regular. °· Discomfort is usually felt in the top of the uterus, and it spreads to the lower abdomen and low back. °· Contractions do not go away with walking. °· Contractions usually become more intense and increase in frequency. °· The cervix dilates and gets thinner. °False labor °· Contractions are usually shorter and not as strong as true labor contractions. °· Contractions are usually irregular. °· Contractions  are often felt in the front of the lower abdomen and in the groin. °· Contractions may go away when you walk around or change positions while lying down. °· Contractions get weaker and are shorter-lasting as time goes on. °· The cervix usually does not dilate or become thin. °Follow these instructions at home: °· Take over-the-counter and prescription medicines only as told by your health care provider. °· Keep up with your usual exercises and follow other instructions from your health care provider. °· Eat and drink lightly if you think you are going into labor. °· If Braxton Hicks contractions are making you uncomfortable: °? Change your position from lying down or resting to walking, or change from walking to resting. °? Sit and rest in a tub of warm water. °? Drink enough fluid to keep your urine pale yellow. Dehydration may cause these contractions. °? Do slow and deep breathing several times an hour. °· Keep all follow-up prenatal visits as told by your health care provider. This is important. °Contact a health care provider if: °· You have a fever. °· You have continuous pain in your abdomen. °Get help right away if: °· Your contractions become stronger, more regular, and closer together. °· You have fluid leaking or gushing from your vagina. °· You pass blood-tinged mucus (bloody show). °· You have bleeding from your vagina. °· You have low back pain that you never had before. °· You feel your baby’s head pushing down and causing pelvic pressure. °· Your baby is not moving inside you as much as it used to. °Summary °· Contractions that occur before labor are called Braxton   Hicks contractions, false labor, or practice contractions. °· Braxton Hicks contractions are usually shorter, weaker, farther apart, and less regular than true labor contractions. True labor contractions usually become progressively stronger and regular and they become more frequent. °· Manage discomfort from Braxton Hicks contractions by  changing position, resting in a warm bath, drinking plenty of water, or practicing deep breathing. °This information is not intended to replace advice given to you by your health care provider. Make sure you discuss any questions you have with your health care provider. °Document Released: 02/07/2017 Document Revised: 02/07/2017 Document Reviewed: 02/07/2017 °Elsevier Interactive Patient Education © 2018 [employer] ° °

## 2018-03-20 NOTE — MAU Note (Signed)
Pt here with c/o contractions since about 1100 this AM. Was 2 cm on last exam. Loosing mucus plug today, but no bleeding or leaking. Reports decreased fetal movement.

## 2018-03-20 NOTE — MAU Note (Signed)
Pt. Presents to MAU with ctx since 1100. States she cannot talk during ctx and rates then 8/10. States they are 7-8 min apart. Reports she was 2 cm on Tuesday at the office. Reports losing mucous plug today. Denies bleeding or leaking. States she has induction scheduled for Monday night.

## 2018-03-24 ENCOUNTER — Encounter (HOSPITAL_COMMUNITY): Payer: Self-pay | Admitting: *Deleted

## 2018-03-24 ENCOUNTER — Inpatient Hospital Stay (HOSPITAL_COMMUNITY): Payer: 59 | Admitting: Anesthesiology

## 2018-03-24 ENCOUNTER — Other Ambulatory Visit: Payer: Self-pay

## 2018-03-24 ENCOUNTER — Inpatient Hospital Stay (HOSPITAL_COMMUNITY)
Admission: AD | Admit: 2018-03-24 | Discharge: 2018-03-26 | DRG: 807 | Disposition: A | Discharge status: Home / Self Care | Payer: 59 | Attending: Obstetrics and Gynecology | Admitting: Obstetrics and Gynecology

## 2018-03-24 DIAGNOSIS — O99214 Obesity complicating childbirth: Secondary | ICD-10-CM | POA: Diagnosis present

## 2018-03-24 DIAGNOSIS — Z3490 Encounter for supervision of normal pregnancy, unspecified, unspecified trimester: Secondary | ICD-10-CM

## 2018-03-24 DIAGNOSIS — E669 Obesity, unspecified: Secondary | ICD-10-CM | POA: Diagnosis present

## 2018-03-24 DIAGNOSIS — Z3A39 39 weeks gestation of pregnancy: Secondary | ICD-10-CM

## 2018-03-24 DIAGNOSIS — O9A213 Injury, poisoning and certain other consequences of external causes complicating pregnancy, third trimester: Secondary | ICD-10-CM

## 2018-03-24 DIAGNOSIS — Z3483 Encounter for supervision of other normal pregnancy, third trimester: Secondary | ICD-10-CM | POA: Diagnosis present

## 2018-03-24 LAB — TYPE AND SCREEN
ABO/RH(D): O POS
Antibody Screen: NEGATIVE

## 2018-03-24 LAB — CBC
HCT: 39.5 % (ref 36.0–46.0)
Hemoglobin: 13.9 g/dL (ref 12.0–15.0)
MCH: 30.7 pg (ref 26.0–34.0)
MCHC: 35.2 g/dL (ref 30.0–36.0)
MCV: 87.2 fL (ref 78.0–100.0)
Platelets: 230 10*3/uL (ref 150–400)
RBC: 4.53 MIL/uL (ref 3.87–5.11)
RDW: 12.6 % (ref 11.5–15.5)
WBC: 14.6 10*3/uL — ABNORMAL HIGH (ref 4.0–10.5)

## 2018-03-24 LAB — RPR: RPR Ser Ql: NONREACTIVE

## 2018-03-24 MED ORDER — FLEET ENEMA 7-19 GM/118ML RE ENEM: 1.0000 | ENEMA | Freq: Every day | RECTAL | Status: DC | PRN

## 2018-03-24 MED ORDER — OXYTOCIN 40 UNITS IN LACTATED RINGERS INFUSION - SIMPLE MED
2.5000 [IU]/h | INTRAVENOUS | Status: DC
  Filled 2018-03-24: qty 1000

## 2018-03-24 MED ORDER — LACTATED RINGERS IV SOLN
500.0000 mL | Freq: Once | INTRAVENOUS | Status: AC
  Administered 2018-03-24: 500 mL via INTRAVENOUS

## 2018-03-24 MED ORDER — COCONUT OIL OIL: 1.0000 "application " | TOPICAL_OIL | Status: DC | PRN

## 2018-03-24 MED ORDER — OXYCODONE-ACETAMINOPHEN 5-325 MG PO TABS: 1.0000 | ORAL_TABLET | ORAL | Status: DC | PRN

## 2018-03-24 MED ORDER — MEASLES, MUMPS & RUBELLA VAC ~~LOC~~ INJ: 0.5000 mL | INJECTION | Freq: Once | SUBCUTANEOUS | Status: DC

## 2018-03-24 MED ORDER — TETANUS-DIPHTH-ACELL PERTUSSIS 5-2.5-18.5 LF-MCG/0.5 IM SUSP: 0.5000 mL | Freq: Once | INTRAMUSCULAR | Status: DC

## 2018-03-24 MED ORDER — PRENATAL MULTIVITAMIN CH
1.0000 | ORAL_TABLET | Freq: Every day | ORAL | Status: DC
  Administered 2018-03-25: 1 via ORAL
  Filled 2018-03-24: qty 1

## 2018-03-24 MED ORDER — FLUOXETINE HCL 20 MG PO TABS
20.0000 mg | ORAL_TABLET | Freq: Every day | ORAL | Status: DC
  Administered 2018-03-24: 20 mg via ORAL
  Filled 2018-03-24 (×2): qty 1

## 2018-03-24 MED ORDER — LACTATED RINGERS IV SOLN: 500.0000 mL | Freq: Once | INTRAVENOUS | Status: DC

## 2018-03-24 MED ORDER — BENZOCAINE-MENTHOL 20-0.5 % EX AERO: 1.0000 "application " | INHALATION_SPRAY | CUTANEOUS | Status: DC | PRN

## 2018-03-24 MED ORDER — ZOLPIDEM TARTRATE 5 MG PO TABS: 5.0000 mg | ORAL_TABLET | Freq: Every evening | ORAL | Status: DC | PRN

## 2018-03-24 MED ORDER — PHENYLEPHRINE 40 MCG/ML (10ML) SYRINGE FOR IV PUSH (FOR BLOOD PRESSURE SUPPORT)
80.0000 ug | PREFILLED_SYRINGE | INTRAVENOUS | Status: DC | PRN
  Filled 2018-03-24: qty 5
  Filled 2018-03-24: qty 10

## 2018-03-24 MED ORDER — ONDANSETRON HCL 4 MG PO TABS: 4.0000 mg | ORAL_TABLET | ORAL | Status: DC | PRN

## 2018-03-24 MED ORDER — DIBUCAINE 1 % RE OINT: 1.0000 "application " | TOPICAL_OINTMENT | RECTAL | Status: DC | PRN

## 2018-03-24 MED ORDER — LACTATED RINGERS IV SOLN
INTRAVENOUS | Status: DC
  Administered 2018-03-24: 12:00:00 via INTRAVENOUS

## 2018-03-24 MED ORDER — FENTANYL 2.5 MCG/ML BUPIVACAINE 1/10 % EPIDURAL INFUSION (WH - ANES)
14.0000 mL/h | INTRAMUSCULAR | Status: DC | PRN
  Administered 2018-03-24: 14 mL/h via EPIDURAL
  Filled 2018-03-24: qty 100

## 2018-03-24 MED ORDER — BISACODYL 10 MG RE SUPP: 10.0000 mg | Freq: Every day | RECTAL | Status: DC | PRN

## 2018-03-24 MED ORDER — SOD CITRATE-CITRIC ACID 500-334 MG/5ML PO SOLN: 30.0000 mL | ORAL | Status: DC | PRN

## 2018-03-24 MED ORDER — DIPHENHYDRAMINE HCL 50 MG/ML IJ SOLN: 12.5000 mg | INTRAMUSCULAR | Status: DC | PRN

## 2018-03-24 MED ORDER — SIMETHICONE 80 MG PO CHEW: 80.0000 mg | CHEWABLE_TABLET | ORAL | Status: DC | PRN

## 2018-03-24 MED ORDER — MEDROXYPROGESTERONE ACETATE 150 MG/ML IM SUSP: 150.0000 mg | INTRAMUSCULAR | Status: DC | PRN

## 2018-03-24 MED ORDER — ONDANSETRON HCL 4 MG/2ML IJ SOLN: 4.0000 mg | INTRAMUSCULAR | Status: DC | PRN

## 2018-03-24 MED ORDER — LIDOCAINE HCL (PF) 1 % IJ SOLN
INTRAMUSCULAR | Status: DC | PRN
  Administered 2018-03-24: 4 mL via EPIDURAL
  Administered 2018-03-24: 4 mL

## 2018-03-24 MED ORDER — OXYCODONE-ACETAMINOPHEN 5-325 MG PO TABS: 2.0000 | ORAL_TABLET | ORAL | Status: DC | PRN

## 2018-03-24 MED ORDER — LACTATED RINGERS IV SOLN: 500.0000 mL | INTRAVENOUS | Status: DC | PRN

## 2018-03-24 MED ORDER — ONDANSETRON HCL 4 MG/2ML IJ SOLN: 4.0000 mg | Freq: Four times a day (QID) | INTRAMUSCULAR | Status: DC | PRN

## 2018-03-24 MED ORDER — ACETAMINOPHEN 325 MG PO TABS
650.0000 mg | ORAL_TABLET | ORAL | Status: DC | PRN
  Administered 2018-03-25: 650 mg via ORAL
  Filled 2018-03-24: qty 2

## 2018-03-24 MED ORDER — EPHEDRINE 5 MG/ML INJ
10.0000 mg | INTRAVENOUS | Status: DC | PRN
  Filled 2018-03-24: qty 2

## 2018-03-24 MED ORDER — OXYTOCIN BOLUS FROM INFUSION
500.0000 mL | Freq: Once | INTRAVENOUS | Status: AC
  Administered 2018-03-24: 500 mL via INTRAVENOUS

## 2018-03-24 MED ORDER — DIPHENHYDRAMINE HCL 25 MG PO CAPS
25.0000 mg | ORAL_CAPSULE | Freq: Four times a day (QID) | ORAL | Status: DC | PRN
Start: 2018-03-24 — End: 2018-03-26

## 2018-03-24 MED ORDER — SENNOSIDES-DOCUSATE SODIUM 8.6-50 MG PO TABS
2.0000 | ORAL_TABLET | ORAL | Status: DC
  Administered 2018-03-24 – 2018-03-25 (×2): 2 via ORAL
  Filled 2018-03-24 (×2): qty 2

## 2018-03-24 MED ORDER — LIDOCAINE HCL (PF) 1 % IJ SOLN
30.0000 mL | INTRAMUSCULAR | Status: DC | PRN
  Filled 2018-03-24: qty 30

## 2018-03-24 MED ORDER — PHENYLEPHRINE 40 MCG/ML (10ML) SYRINGE FOR IV PUSH (FOR BLOOD PRESSURE SUPPORT)
80.0000 ug | PREFILLED_SYRINGE | INTRAVENOUS | Status: DC | PRN
  Filled 2018-03-24: qty 5

## 2018-03-24 MED ORDER — ACETAMINOPHEN 325 MG PO TABS: 650.0000 mg | ORAL_TABLET | ORAL | Status: DC | PRN

## 2018-03-24 MED ORDER — WITCH HAZEL-GLYCERIN EX PADS: 1.0000 "application " | MEDICATED_PAD | CUTANEOUS | Status: DC | PRN

## 2018-03-24 MED ORDER — IBUPROFEN 600 MG PO TABS
600.0000 mg | ORAL_TABLET | Freq: Four times a day (QID) | ORAL | Status: DC
  Administered 2018-03-24 – 2018-03-26 (×7): 600 mg via ORAL
  Filled 2018-03-24 (×7): qty 1

## 2018-03-24 NOTE — Anesthesia Postprocedure Evaluation (Signed)
Anesthesia Post Note  Patient: Laura Gamble  Procedure(s) Performed: AN AD [place]     Patient location during evaluation: Mother Baby Anesthesia Type: Epidural Level of consciousness: awake, awake and alert and oriented Pain management: pain level controlled Vital Signs Assessment: post-procedure vital signs reviewed and stable Respiratory status: spontaneous breathing, nonlabored ventilation and respiratory function stable Cardiovascular status: stable Postop Assessment: no headache, no backache, adequate PO intake, able to ambulate and patient able to bend at knees Anesthetic complications: no    Last Vitals:  Vitals:   03/24/18 1454 03/24/18 1610  BP: 123/78 130/72  Pulse: 72 69  Resp: 20   Temp: 37.2 C (!) 36.3 C  SpO2: 100%     Last Pain:  Vitals:   03/24/18 1610  TempSrc: Oral  PainSc:    Pain Goal:                 [name]

## 2018-03-24 NOTE — Anesthesia Procedure Notes (Signed)
Epidural Patient location during procedure: OB Start time: 03/24/2018 9:24 AM  Staffing Anesthesiologist: Josephine Igo, MD Performed: anesthesiologist   Preanesthetic Checklist Completed: patient identified, site marked, surgical consent, pre-op evaluation, timeout performed, IV checked, risks and benefits discussed and monitors and equipment checked  Epidural Patient position: sitting Prep: site prepped and draped and DuraPrep Patient monitoring: continuous pulse ox and blood pressure Approach: midline Location: L3-L4 Injection technique: LOR air  Needle:  Needle type: Tuohy  Needle gauge: 17 G Needle length: 9 cm and 9 Needle insertion depth: 5 cm cm Catheter type: closed end flexible Catheter size: 19 Gauge Catheter at skin depth: 10 cm Test dose: negative and Other  Assessment Events: blood not aspirated, injection not painful, no injection resistance, negative IV test and no paresthesia  Additional Notes Patient identified. Risks and benefits discussed including failed block, incomplete  Pain control, post dural puncture headache, nerve damage, paralysis, blood pressure Changes, nausea, vomiting, reactions to medications-both toxic and allergic and post Partum back pain. All questions were answered. Patient expressed understanding and wished to proceed. Sterile technique was used throughout procedure. Epidural site was Dressed with sterile barrier dressing. No paresthesias, signs of intravascular injection Or signs of intrathecal spread were encountered.  Patient was more comfortable after the epidural was dosed. Please see RN's note for documentation of vital signs and FHR which are stable.

## 2018-03-24 NOTE — MAU Note (Signed)
Pt presents with complaint of contractions since yesterday. Denies bleeding.

## 2018-03-24 NOTE — Anesthesia Preprocedure Evaluation (Signed)
Anesthesia Evaluation  Patient identified by MRN, date of birth, ID band Patient awake    Reviewed: Allergy & Precautions, Patient's Chart, lab work & pertinent test results  Airway Mallampati: II  TM Distance: >3 FB Neck ROM: Full    Dental  (+) Teeth Intact   Pulmonary neg pulmonary ROS,    Pulmonary exam normal breath sounds clear to auscultation       Cardiovascular negative cardio ROS Normal cardiovascular exam Rhythm:Regular Rate:Normal     Neuro/Psych  Headaches, PSYCHIATRIC DISORDERS Anxiety Depression Hx/o Panic attacks   GI/Hepatic Neg liver ROS, GERD  ,  Endo/Other  Obesity  Renal/GU negative Renal ROS  negative genitourinary   Musculoskeletal negative musculoskeletal ROS (+)   Abdominal (+) + obese,   Peds  Hematology negative hematology ROS (+)   Anesthesia Other Findings   Reproductive/Obstetrics (+) Pregnancy                             Anesthesia Physical Anesthesia Plan  ASA: II  Anesthesia Plan: Epidural   Post-op Pain Management:    Induction:   PONV Risk Score and Plan:   Airway Management Planned: Natural Airway  Additional Equipment:   Intra-op Plan:   Post-operative Plan:   Informed Consent: I have reviewed the patients History and Physical, chart, labs and discussed the procedure including the risks, benefits and alternatives for the proposed anesthesia with the patient or authorized representative who has indicated his/her understanding and acceptance.     Plan Discussed with: Anesthesiologist  Anesthesia Plan Comments:         Anesthesia Quick Evaluation

## 2018-03-24 NOTE — H&P (Signed)
Laura Gamble is a 27 y.o. G 2 P 1 at 44 w 3 days presents in active labor.  OB History    Gravida  2   Para  1   Term  1   Preterm      AB      Living  1     SAB      TAB      Ectopic      Multiple  0   Live Births  1          Past Medical History:  Diagnosis Date  . Depression 07/01/2013  . Generalized anxiety disorder 04/28/2013  . Panic attack    Past Surgical History:  Procedure Laterality Date  . NO PAST SURGERIES     Family History: family history includes Anxiety disorder in her mother; Bipolar disorder in her mother; Depression in her father and mother; Hypertension in her maternal aunt, maternal grandfather, maternal grandmother, maternal uncle, and mother. Social History:  reports that she has never smoked. She has never used smokeless tobacco. She reports that she does not drink alcohol or use drugs.     Maternal Diabetes: No Genetic Screening: Normal Maternal Ultrasounds/Referrals: Normal Fetal Ultrasounds or other Referrals:  None Maternal Substance Abuse:  No Significant Maternal Medications:  None Significant Maternal Lab Results:  None Other Comments:  None  Review of Systems  All other systems reviewed and are negative.  Maternal Medical History:  Reason for admission: Contractions.     Dilation: 3 Effacement (%): 90 Station: -1 Exam by:: [place],RN Blood pressure 138/79, pulse 92, temperature 98.2 F (36.8 C), temperature source Oral, resp. rate 20, height 5\' 6"  (1.676 m), weight 86.6 kg (191 lb), last menstrual period 03/14/2017, SpO2 100 %, currently breastfeeding. Maternal Exam:  Abdomen: Fetal presentation: vertex     Fetal Exam Fetal State Assessment: Category I - tracings are normal.     Physical Exam  Nursing note and vitals reviewed. Constitutional: She appears well-developed and well-nourished.  HENT:  Head: Normocephalic.  Eyes: Pupils are equal, round, and reactive to light.  Neck: Normal range of  motion.  Cardiovascular: Normal rate and regular rhythm.  Respiratory: Effort normal.    Prenatal labs: ABO, Rh: O/Positive/-- (11/07 0000) Antibody: Negative (11/07 0000) Rubella: Immune (11/07 0000) RPR: Nonreactive (11/07 0000)  HBsAg: Negative (11/07 0000)  HIV: Non-reactive (11/07 0000)  GBS: Negative (06/06 0000)   Assessment/Plan: IUP at term Labor Admit Epidural  AROM  Evelyne Makepeace L 03/24/2018, 8:25 AM

## 2018-03-24 NOTE — Anesthesia Pain Management Evaluation Note (Signed)
  CRNA Pain Management Visit Note  Patient: Laura Gamble, 27 y.o., female  "Hello I am a member of the anesthesia team at [facility]. We have an anesthesia team available at all times to provide care throughout the hospital, including epidural management and anesthesia for C-section. I don't know your plan for the delivery whether it a natural birth, water birth, IV sedation, nitrous supplementation, doula or epidural, but we want to meet your pain goals."   1.Was your pain managed to your expectations on prior hospitalizations?   Yes   2.What is your expectation for pain management during this hospitalization?     Epidural  3.How can we help you reach that goal? Epidural  Record the patient's initial score and the patient's pain goal.   Pain: 0  Pain Goal: 5 [facility] wants you to be able to say your pain was always managed very well.  Bufford Spikes 03/24/2018

## 2018-03-25 ENCOUNTER — Inpatient Hospital Stay (HOSPITAL_COMMUNITY): Admission: RE | Admit: 2018-03-25 | Payer: 59 | Source: Ambulatory Visit

## 2018-03-25 LAB — CBC
HCT: 36 % (ref 36.0–46.0)
Hemoglobin: 12.5 g/dL (ref 12.0–15.0)
MCH: 30.8 pg (ref 26.0–34.0)
MCHC: 34.7 g/dL (ref 30.0–36.0)
MCV: 88.7 fL (ref 78.0–100.0)
Platelets: 184 10*3/uL (ref 150–400)
RBC: 4.06 MIL/uL (ref 3.87–5.11)
RDW: 12.8 % (ref 11.5–15.5)
WBC: 12.2 10*3/uL — ABNORMAL HIGH (ref 4.0–10.5)

## 2018-03-25 LAB — RUBELLA SCREEN: Rubella: 2.56 index (ref >0.99)

## 2018-03-25 MED ORDER — FLUOXETINE HCL 20 MG PO CAPS
30.0000 mg | ORAL_CAPSULE | Freq: Every day | ORAL | Status: DC
  Administered 2018-03-25: 30 mg via ORAL
  Filled 2018-03-25: qty 1

## 2018-03-25 MED ORDER — FLUOXETINE HCL 20 MG PO CAPS
30.0000 mg | ORAL_CAPSULE | Freq: Every day | ORAL | Status: DC
  Filled 2018-03-25 (×2): qty 1

## 2018-03-25 NOTE — Progress Notes (Signed)
Psychosocial assessment completed.  CSW spent over an hour speaking with parents about emotional concerns/PMAD education.  MOB would like to increase Prozac to 30mg .  Lactation Consultant/K. Angela Cox states this is acceptable in breast feeding.  CSW contacted Dr. Lovie Chol who agrees to increase medication.  [place] resources given.  No barriers to discharge.  Full documentation to follow.

## 2018-03-25 NOTE — Progress Notes (Signed)
Patient called out on call light feels light headed and anxious. When I arrived she told me it might be a panic attack. Patients BP and HR wnl, patient appears comfortable, resting in bed with fob supportive at the bedside. Fundal assessment and vaginal bleeding wnl.  Asked what she usually does when she feel and attack coming on, and she said she "usually closes her eyes and goes somewhere else". Encouraged patient to call if needs any assistance or wants to talk.

## 2018-03-25 NOTE — Progress Notes (Signed)
Rn reviewed Postpartum depression score of 17 with patient . Rn put in a social work consult for patient in am.

## 2018-03-25 NOTE — Lactation Note (Signed)
This note was copied from a baby's chart. Lactation Consultation Note  Patient Name: Girl Kynlee Koenigsberg [id number] Date: 03/25/2018   RN request to not visit this room tonight; states mother is doing well.                 Eli Adami R Jancie Kercher 03/25/2018, 1:47 AM

## 2018-03-25 NOTE — Progress Notes (Signed)
Post Partum Day 1 Subjective: no complaints  Objective: Blood pressure 134/88, pulse 60, temperature 98 F (36.7 C), temperature source Oral, resp. rate 18, height 5\' 6"  (1.676 m), weight 191 lb (86.6 kg), last menstrual period 03/14/2017, SpO2 100 %, unknown if currently breastfeeding.  Physical Exam:  General: alert Lochia: appropriate Uterine Fundus: firm Incision: healing well DVT Evaluation: No evidence of DVT seen on physical exam.  Recent Labs    03/24/18 0830 03/25/18 0646  HGB 13.9 12.5  HCT 39.5 36.0    Assessment/Plan: Plan for discharge tomorrow   LOS: 1 day   Margarette Asal 03/25/2018, 8:10 AM

## 2018-03-25 NOTE — Lactation Note (Signed)
This note was copied from a baby's chart. Lactation Consultation Note  Patient Name: Laura Gamble [id number] Date: 03/25/2018 Reason for consult: Initial assessment  Mom is a P2 who successfully nursed her 1st child for 18 months. Mom said that "[name]" is nursing better than her 1st child did. Mom reports that her "milk came in this morning," but Mom's breasts palpate soft. Infant had maintained latch (wide gape) despite falling asleep at the breast.    Mom made aware of O/P services, breastfeeding support groups, community resources, and our phone # for post-discharge questions.   Infant w/an ABO set-up, DAT neg.  Matthias Hughs [facility] 03/25/2018, 1:49 PM

## 2018-03-26 MED ORDER — FLUOXETINE HCL 10 MG PO CAPS: 30.0000 mg | ORAL_CAPSULE | Freq: Every day | ORAL | 3 refills | Status: DC

## 2018-03-26 NOTE — Lactation Note (Signed)
This note was copied from a baby's chart. Lactation Consultation Note  Patient Name: Laura Gamble BTCYE'[id number] Date: 03/26/2018 Reason for consult: Follow-up assessment;Infant weight loss(See doc. flowsheets; wet and stools for age correlates with weight loss.  ) 45 hours female infant [redacted] weeks gestation. Mom is P2 with previous breastfeeding experience. Per mom she prefers the cradle position but has hx of plugged ducts and mastitis. LC recommended at least using two  other rotating  breastfeeding positions. LC showed Mom how to use football position infant had audible swallows, wipe gape with good latch.but Mom switch back to cradle hold within 5 minutes.    Reviewed sore nipples  and engorgement prevention and treatment. Reviewed signs that your baby is getting enough in Mother &Baby care booklet page 16.   Mom made aware of O/P services, breastfeeding support groups, community resources, and our phone # for post-discharge questions.   Maternal Data Has patient been taught Hand Expression?: Yes  Feeding Feeding Type: Breast Fed Length of feed: 5 min  LATCH Score Latch: Grasps breast easily, tongue down, lips flanged, rhythmical sucking.(Using  football hold , baby good pattern but Mom switch back cradle )  Audible Swallowing: Spontaneous and intermittent  Type of Nipple: Everted at rest and after stimulation  Comfort (Breast/Nipple): Soft / non-tender  Hold (Positioning): Assistance needed to correctly position infant at breast and maintain latch.  LATCH Score: 9  Interventions Interventions: Skin to skin;Support pillows;Position options;Hand express;Pre-pump if needed;Breast compression;Breast feeding basics reviewed;DEBP;Ice;Expressed milk  Lactation Tools Discussed/Used Tools: Pump Pump Review: Milk Storage(LC discussed milk storage and collection.)   Consult Status Consult Status: Complete Date: 03/26/18 Follow-up type: In-patient    Vicente Serene 03/26/2018,  10:47 AM

## 2018-03-26 NOTE — Discharge Summary (Signed)
Obstetric Discharge Summary Reason for Admission: onset of labor Prenatal Procedures: none Intrapartum Procedures: spontaneous vaginal delivery Postpartum Procedures: none Complications-Operative and Postpartum: 2 degree perineal laceration Hemoglobin  Date Value Ref Range Status  03/25/2018 12.5 12.0 - 15.0 g/dL Final   HCT  Date Value Ref Range Status  03/25/2018 36.0 36.0 - 46.0 % Final    Physical Exam:  General: alert, cooperative, appears stated age and no distress Lochia: appropriate Uterine Fundus: firm Incision: healing well DVT Evaluation: No evidence of DVT seen on physical exam.  Discharge Diagnoses: Term Pregnancy-delivered  Discharge Information: Date: 03/26/2018 Activity: pelvic rest Diet: routine Medications: prozac Condition: stable Instructions: refer to practice specific booklet Discharge to: home   Newborn Data: Live born female  Birth Weight: 7 lb 10.4 oz (3470 g) APGAR: 8, 9  Newborn Delivery   Birth date/time:  03/24/2018 13:22:00 Delivery type:  Vaginal, Spontaneous     Home with mother.  Laura Gamble 03/26/2018, 8:35 AM

## 2018-03-27 NOTE — Progress Notes (Signed)
CSW received consult for hx of Anxiety and Depression.  Of note, MOB scored a 17 on the Edinburgh Postnatal Depression Scale (question 10=0).  CSW met with MOB and FOB to offer support and complete assessment.   Parents were extremely friendly and receptive to CSW's intervention.  MOB was open to talking about mental health hx and current concerns.  FOB was highly engaged in the conversation and clearly very supportive towards MOB.   MOB reports struggling with anxiety most of her life, but recently experiencing symptoms of depression, which is new for her.  She states she spoke with her OB and was prescribed Prozac, as this has worked well to treat her anxiety in the past.  She states she was prescribed 63m during pregnancy, but 334mis "my magic dose."  She would like to increase if it is safe to do so while breast feeding.  CSW confirmed with lactation consultant that it is and contacted Dr. HoMatthew Saraso increase the dose, to which he agreed.  Parents were very appreciative.  MOB reports good coping skills and mentions that Anxiety and Depression runs in her family.  She seems to have a very good awareness of her symptoms.  We spoke at length about coping skills and mindfulness.  MOB talked about how she felt while taking the EdLes[place]ond acknowledged that it was scary to see her high score.  CSW spoke at length about this screening tool, other tools to utilize, when to seek help, and strongly recommended outpatient counseling in addition to medication management.  MOB is willing and interested.  CSW provided her with resources on how to find a therapist that she feels is right for her.  CSW also spoke about the importance of seeking a psychiatrist or psychiatric NP to manage her medications, not because an OB isn't capable, but because a psychiatrist is the specialist.  MOB stated understanding. CSW provided education regarding the baby blues period vs. perinatal mood disorders.    CSW recommends  self-evaluation during the postpartum time period using the New Mom Checklist from Postpartum Progress, as well as the EdLesothoostnatal Depression Scale, noted that neither screen is a diagnostic tool, and that both are good conversation starters with her providers when concerns arise.  CSW encouraged MOB to contact a medical professional if she has concerns at any time.   CSW provided review of Sudden Infant Death Syndrome (SIDS) precautions.  CSW spoke about anxiety as it relates to putting her baby to bed and suggests that once parents know they have put their baby to bed safely, the rest is no longer within their control and they must sleep.  Inability to sleep when their infant is sleeping is another indication of potential concern for PMADs, and should be reported to a medical professional if this persists.  Parents were extremely grateful for CSW's concern for their wellbeing and the information provided. CSW identifies no further need for intervention and no barriers to discharge at this time.

## 2018-04-23 ENCOUNTER — Encounter: Payer: 59 | Admitting: Family Medicine

## 2018-05-27 NOTE — Progress Notes (Signed)
Dr. Frederico Hamman T. Brena Windsor, MD, [facility] [place], [address] Phone: [phone] Fax: [phone]  05/28/2018  Patient: Laura Gamble, MRN: [id number], DOB: 05-14-1991, 27 y.o.  Primary Physician:  Owens Loffler, MD   Chief Complaint  Patient presents with  . Annual Exam   Subjective:   Laquonda Welby is a 27 y.o. pleasant patient who presents with the following:  Health Maintenance Summary Reviewed and updated, unless pt declines services.  Tobacco History Reviewed. Non-smoker Alcohol: No concerns, no excessive use Exercise Habits: Running most days a week now, working towards 3 miles a day STD concerns: none Drug Use: None Birth control method: none yet - will start OCP Menses regular: yes Lumps or breast concerns: no Breast Cancer Family History: no  Flu shot.  Going back to school this fall.   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  Sleep is going OK.   Still having some depression. Currently on Prozac 30 mg a day - got really bad after baby. Kept 2 additional days in the hospital after birth. Now with 2 months post-partum. Anxiety is doing better.   Check Quant TB   Health Maintenance  Topic Date Due  . INFLUENZA VACCINE  05/08/2018  . TETANUS/TDAP  06/29/2019  . PAP SMEAR  09/06/2020  . HIV Screening  Completed    Immunization History  Administered Date(s) Administered  . H1N1 08/13/2008  . HPV 9-valent 07/21/2015  . Hepatitis B 07/31/2004, 09/04/2004, 01/31/2005  . Hpv 05/23/2010, 08/01/2010  . Influenza Split 10/24/2012  . Influenza,inj,Quad PF,6+ Mos 07/01/2017  . Influenza-Unspecified 07/11/2015  . PPD Test 03/05/2012  . Tdap 06/28/2009   Patient Active Problem List   Diagnosis Date Noted  . Traumatic injury during pregnancy in third trimester 01/20/2018  . Common migraine 08/04/2013  . Major depression, recurrent ([place]) 07/01/2013  . Generalized  anxiety disorder 04/28/2013  . Panic attack 12/10/2011   Past Medical History:  Diagnosis Date  . Depression 07/01/2013  . Generalized anxiety disorder 04/28/2013  . Panic attack    Past Surgical History:  Procedure Laterality Date  . NO PAST SURGERIES     Social History   Socioeconomic History  . Marital status: Married    Spouse name: Not on file  . Number of children: Not on file  . Years of education: Not on file  . Highest education level: Not on file  Occupational History  . Not on file  Social Needs  . Financial resource strain: Not on file  . Food insecurity:    Worry: Not on file    Inability: Not on file  . Transportation needs:    Medical: Not on file    Non-medical: Not on file  Tobacco Use  . Smoking status: Never Smoker  . Smokeless tobacco: Never Used  Substance and Sexual Activity  . Alcohol use: No  . Drug use: No  . Sexual activity: Yes    Birth control/protection: None  Lifestyle  . Physical activity:    Days per week: Not on file    Minutes per session: Not on file  . Stress: Not on file  Relationships  . Social connections:    Talks on phone: Not on file    Gets together: Not on file    Attends religious service: Not on file    Active member of club or organization: Not on file    Attends meetings  of clubs or organizations: Not on file    Relationship status: Not on file  . Intimate partner violence:    Fear of current or ex partner: Not on file    Emotionally abused: Not on file    Physically abused: Not on file    Forced sexual activity: Not on file  Other Topics Concern  . Not on file  Social History Narrative   student   Family History  Problem Relation Age of Onset  . Anxiety disorder Mother   . Hypertension Mother   . Depression Mother   . Bipolar disorder Mother   . Depression Father   . Hypertension Maternal Grandfather   . Hypertension Maternal Aunt   . Hypertension Maternal Uncle   . Hypertension Maternal Grandmother     No Known Allergies  Medication list has been reviewed and updated.   General: Denies fever, chills, sweats. No significant weight loss. Eyes: Denies blurring,significant itching ENT: Denies earache, sore throat, and hoarseness.  Cardiovascular: Denies chest pains, palpitations, dyspnea on exertion,  Respiratory: Denies cough, dyspnea at rest,wheeezing Breast: no concerns about lumps GI: Denies nausea, vomiting, diarrhea, constipation, change in bowel habits, abdominal pain, melena, hematochezia GU: Denies dysuria, hematuria, urinary hesitancy, nocturia, denies STD risk, no concerns about discharge Musculoskeletal: Denies back pain, joint pain Derm: Denies rash, itching Neuro: Denies  paresthesias, frequent falls, frequent headaches Psych: ONGOING DEPRESSION Endocrine: Denies cold intolerance, heat intolerance, polydipsia Heme: Denies enlarged lymph nodes Allergy: No hayfever  Objective:   BP 100/68   Pulse 83   Temp 98.7 F (37.1 C) (Oral)   Ht 5' 5.5" (1.664 m)   Wt 171 lb (77.6 kg)   BMI 28.02 kg/m  No exam data present  GEN: well developed, well nourished, no acute distress Eyes: conjunctiva and lids normal, PERRLA, EOMI ENT: TM clear, nares clear, oral exam WNL Neck: supple, no lymphadenopathy, no thyromegaly, no JVD Pulm: clear to auscultation and percussion, respiratory effort normal CV: regular rate and rhythm, S1-S2, no murmur, rub or gallop, no bruits Chest: no scars, masses, no lumps BREAST: breast exam declined GI: soft, non-tender; no hepatosplenomegaly, masses; active bowel sounds all quadrants GU: GU exam declined Lymph: no cervical, axillary or inguinal adenopathy MSK: gait normal, muscle tone and strength WNL, no joint swelling, effusions, discoloration, crepitus  SKIN: clear, good turgor, color WNL, no rashes, lesions, or ulcerations Neuro: normal mental status, normal strength, sensation, and motion Psych: alert; oriented to person, place and  time, normally interactive and not anxious or depressed in appearance.  All labs reviewed with patient.  Assessment and Plan:   Healthcare maintenance   Globally doing well Depression is better but not euthymic. I think increasing prozac to 40 mg would be a good idea. She is breast feeding, had a poor prior response to zoloft and was on prozac during pregnancy. I believe benefits outweigh risks. Known 10 years and we have good rapport - she will call me if she worsens in any way.   Paperwork for school completed.  Health Maintenance Exam: The patient's preventative maintenance and recommended screening tests for an annual wellness exam were reviewed in full today. Brought up to date unless services declined.  Counselled on the importance of diet, exercise, and its role in overall health and mortality. The patient's FH and SH was reviewed, including their home life, tobacco status, and drug and alcohol status.  Follow-up in 1 year for physical exam or additional follow-up below.  Follow-up: No follow-ups on  file. Or follow-up in 1 year if not noted.  Signed,  Maud Deed. Amaya Blakeman, MD   Allergies as of 05/28/2018   No Known Allergies     Medication List        Accurate as of 05/28/18  2:36 PM. Always use your most recent med list.          FLUoxetine 10 MG capsule Commonly known as:  PROZAC Take 3 capsules (30 mg total) by mouth at bedtime.

## 2018-05-28 ENCOUNTER — Ambulatory Visit: Payer: 59 | Admitting: Family Medicine

## 2018-05-28 ENCOUNTER — Encounter: Payer: Self-pay | Admitting: Family Medicine

## 2018-05-28 VITALS — BP 100/68 | HR 83 | Temp 98.7°F | Ht 65.5 in | Wt 171.0 lb

## 2018-05-28 DIAGNOSIS — Z111 Encounter for screening for respiratory tuberculosis: Secondary | ICD-10-CM

## 2018-05-28 DIAGNOSIS — Z23 Encounter for immunization: Secondary | ICD-10-CM | POA: Diagnosis not present

## 2018-05-28 DIAGNOSIS — Z0000 Encounter for general adult medical examination without abnormal findings: Secondary | ICD-10-CM | POA: Diagnosis not present

## 2018-05-28 MED ORDER — FLUOXETINE HCL 40 MG PO CAPS: 40.0000 mg | ORAL_CAPSULE | Freq: Every day | ORAL | 1 refills | Status: DC

## 2018-05-29 LAB — CBC WITH DIFFERENTIAL/PLATELET
Basophils Absolute: 0 10*3/uL (ref 0.0–0.1)
Basophils Relative: 0.7 % (ref 0.0–3.0)
Eosinophils Absolute: 0.1 10*3/uL (ref 0.0–0.7)
Eosinophils Relative: 1.9 % (ref 0.0–5.0)
HCT: 38.4 % (ref 36.0–46.0)
Hemoglobin: 12.9 g/dL (ref 12.0–15.0)
Lymphocytes Relative: 37 % (ref 12.0–46.0)
Lymphs Abs: 2.4 10*3/uL (ref 0.7–4.0)
MCHC: 33.5 g/dL (ref 30.0–36.0)
MCV: 87.3 fl (ref 78.0–100.0)
Monocytes Absolute: 0.4 10*3/uL (ref 0.1–1.0)
Monocytes Relative: 6.2 % (ref 3.0–12.0)
Neutro Abs: 3.5 10*3/uL (ref 1.4–7.7)
Neutrophils Relative %: 54.2 % (ref 43.0–77.0)
Platelets: 309 10*3/uL (ref 150.0–400.0)
RBC: 4.39 Mil/uL (ref 3.87–5.11)
RDW: 12.5 % (ref 11.5–15.5)
WBC: 6.5 10*3/uL (ref 4.0–10.5)

## 2018-05-29 LAB — BASIC METABOLIC PANEL
BUN: 9 mg/dL (ref 6–23)
CO2: 28 mEq/L (ref 19–32)
Calcium: 9.6 mg/dL (ref 8.4–10.5)
Chloride: 105 mEq/L (ref 96–112)
Creatinine, Ser: 0.82 mg/dL (ref 0.40–1.20)
GFR: 89.37 mL/min (ref >60.00)
Glucose, Bld: 83 mg/dL (ref 70–99)
Potassium: 4.2 mEq/L (ref 3.5–5.1)
Sodium: 141 mEq/L (ref 135–145)

## 2018-05-29 LAB — HEPATIC FUNCTION PANEL
ALT: 49 U/L — ABNORMAL HIGH (ref 0–35)
AST: 33 U/L (ref 0–37)
Albumin: 4.6 g/dL (ref 3.5–5.2)
Alkaline Phosphatase: 67 U/L (ref 39–117)
Bilirubin, Direct: 0.1 mg/dL (ref 0.0–0.3)
Total Bilirubin: 0.6 mg/dL (ref 0.2–1.2)
Total Protein: 7.6 g/dL (ref 6.0–8.3)

## 2018-05-29 LAB — LIPID PANEL
Cholesterol: 199 mg/dL (ref 0–200)
HDL: 52.7 mg/dL (ref >39.00)
LDL Cholesterol: 121 mg/dL — ABNORMAL HIGH (ref 0–99)
NonHDL: 146.26
Total CHOL/HDL Ratio: 4
Triglycerides: 125 mg/dL (ref 0.0–149.0)
VLDL: 25 mg/dL (ref 0.0–40.0)

## 2018-05-29 LAB — TSH: TSH: 0.51 u[IU]/mL (ref 0.35–4.50)

## 2018-05-30 LAB — QUANTIFERON-TB GOLD PLUS
Mitogen-NIL: >10 IU/mL
NIL: 0.06 IU/mL
QuantiFERON-TB Gold Plus: NEGATIVE
TB1-NIL: 0.01 IU/mL
TB2-NIL: <0 IU/mL

## 2018-06-02 ENCOUNTER — Encounter: Payer: Self-pay | Admitting: *Deleted

## 2018-06-11 ENCOUNTER — Encounter: Payer: 59 | Admitting: Family Medicine

## 2018-06-13 ENCOUNTER — Telehealth: Payer: Self-pay | Admitting: Family Medicine

## 2018-06-13 DIAGNOSIS — Z789 Other specified health status: Secondary | ICD-10-CM

## 2018-06-13 NOTE — Telephone Encounter (Signed)
Pt scheduled an appt via mychart stating she needs a varicella vaccine for school. Does she need to see you or can this just be a nurse visit?

## 2018-06-16 NOTE — Telephone Encounter (Signed)
Left message for [facility] to return my call.  Need to find out if she thinks her school will take a varicella titer or if she definitely needs the vaccine.  She is scheduled to see Dr. Lorelei Pont on 06/23/2018 for varicella vaccine.  If she states she needs the varicella vaccine, she just needs to be scheduled for a nurse visit.  If she thinks her school will take the titer, we just need to schedule her for a lab appointment.  Ok to [facility] Triage to talk with and schedule when she calls back.

## 2018-06-16 NOTE — Telephone Encounter (Signed)
At age 27, she is most likely varicella immune. We should be able to do a titer only to check her status.   If she has a strong preference, then we could do a nurse visit vaccine instead.

## 2018-06-17 NOTE — Telephone Encounter (Signed)
Will forward note to Dr. Lorelei Pont to put in future lab order for Varicella titer.

## 2018-06-17 NOTE — Telephone Encounter (Signed)
Left message for Laura Gamble to return my call

## 2018-06-17 NOTE — Telephone Encounter (Signed)
Pt returning call and information relayed to pt per notes of Donna,CMA on 06/17/18. Pt states that her school will accept the varicella titer. Lab appt scheduled on 9/16 for varicella titer and 9/16 visit for vaccine cancelled. Pt verbalized understanding and has no further questions at this time.

## 2018-06-18 ENCOUNTER — Other Ambulatory Visit: Payer: Self-pay | Admitting: Family Medicine

## 2018-06-18 DIAGNOSIS — Z0184 Encounter for antibody response examination: Secondary | ICD-10-CM

## 2018-06-18 NOTE — Addendum Note (Signed)
Addended by: Owens Loffler on: 06/18/2018 01:50 PM   Modules accepted: Orders

## 2018-06-18 NOTE — Telephone Encounter (Signed)
Would you check that I ordered this correctly?

## 2018-06-23 ENCOUNTER — Ambulatory Visit: Payer: 59 | Admitting: Family Medicine

## 2018-06-23 ENCOUNTER — Other Ambulatory Visit (INDEPENDENT_AMBULATORY_CARE_PROVIDER_SITE_OTHER): Payer: 59

## 2018-06-23 DIAGNOSIS — Z0184 Encounter for antibody response examination: Secondary | ICD-10-CM

## 2018-06-23 DIAGNOSIS — B20 Human immunodeficiency virus [HIV] disease: Secondary | ICD-10-CM

## 2018-06-23 NOTE — Addendum Note (Signed)
Addended by: Lendon Collar on: 06/23/2018 11:32 AM   Modules accepted: Orders

## 2018-06-24 LAB — VARICELLA ZOSTER ANTIBODY, IGG: Varicella IgG: 1274 index

## 2018-07-09 ENCOUNTER — Other Ambulatory Visit: Payer: Self-pay | Admitting: Family Medicine

## 2018-07-09 ENCOUNTER — Encounter: Payer: Self-pay | Admitting: Family Medicine

## 2018-07-09 DIAGNOSIS — Z0184 Encounter for antibody response examination: Secondary | ICD-10-CM

## 2018-07-09 NOTE — Telephone Encounter (Signed)
Laura Gamble,   Can you help Laura Gamble get set up for a titer or Hepatitis B immunity?

## 2018-07-11 ENCOUNTER — Other Ambulatory Visit (INDEPENDENT_AMBULATORY_CARE_PROVIDER_SITE_OTHER): Payer: 59

## 2018-07-11 ENCOUNTER — Other Ambulatory Visit: Payer: 59

## 2018-07-11 DIAGNOSIS — Z0184 Encounter for antibody response examination: Secondary | ICD-10-CM | POA: Diagnosis not present

## 2018-07-12 LAB — HEPATITIS B SURFACE ANTIBODY, QUANTITATIVE: Hepatitis B-Post: 16 m[IU]/mL (ref >=10)

## 2018-07-25 ENCOUNTER — Ambulatory Visit: Payer: Self-pay | Admitting: *Deleted

## 2018-07-25 NOTE — Telephone Encounter (Signed)
Pt called in c/o having rectal bleeding.   She is 4 months post partum.   She had hemorrhoids after the birth of her first child but not this bad. This time she is having more bleeding that seems to be getting worse over the last [redacted] weeks along with pain with having BMs.  No blood in the stool but it is dripping into the toilet when she is having a BM.   She can feel the hemorrhoids "pop" when she is having a BM.   She is not straining nor does she have diarrhea or constipation.  Going her normal twice a day with regular BMs.  She is also c/o right pelvic pain with radiation around to her right back.   She mentioned she has a history of ovarian cysts and doesn't know it it's that or the pain is related to the rectal bleeding.  See triage notes.  There were no appts available at the [facility] practice so I referred her to the urgent care.   She mentioned there is an urgent care near her that she will go to.   I also let her know about the [facility].    She had mentioned just moving to this area so I let her know about [facility] in case the urgent care near her did not work out. She verbalized understanding and was agreeable to going to the urgent care.   Reason for Disposition . MODERATE rectal bleeding (small blood clots, passing blood without stool, or toilet water turns red)    Has post partum hemorrhoids  Answer Assessment - Initial Assessment Questions 1. APPEARANCE of BLOOD: "What color is it?" "Is it passed separately, on the surface of the stool, or mixed in with the stool?"      4 months post partum.   I'm bleeding from my rectum.   The last 2 weeks it's been getting worse.   I'm passing more blood and it hurts.   I'm having pelvic pain.   2. AMOUNT: "How much blood was passed?"      It's not in stool.   I can feel the hemorrhoids when I go to the bathroom.   The blood is really bright red and it's dripping into the toilet when I have a BM. 3.  FREQUENCY: "How many times has blood been passed with the stools?"      I go twice a day.   Every time I've gone for the last 48 hours I've been 4 times I've had bright red blood and I feel the "pop" in my rectum.   And it hurts to have a BM.  My pelvic area hurts. 4. ONSET: "When was the blood first seen in the stools?" (Days or weeks)      Probably 2 months after the birth.   About mid August.  It was on and off. 5. DIARRHEA: "Is there also some diarrhea?" If so, ask: "How many diarrhea stools were passed in past 24 hours?"      No diarrhea 6. CONSTIPATION: "Do you have constipation?" If so, "How bad is it?"     No   Regular stools. 7. RECURRENT SYMPTOMS: "Have you had blood in your stools before?" If so, ask: "When was the last time?" and "What happened that time?"      With my first child I had a little but nothing like this. 8. BLOOD THINNERS: "Do you take any blood thinners?" (e.g., Coumadin/warfarin, Pradaxa/dabigatran,  aspirin)     No 9. OTHER SYMPTOMS: "Do you have any other symptoms?"  (e.g., abdominal pain, vomiting, dizziness, fever)     Pelvic pain towards right of pelvis and radiates to my back on the right.   I'm prone to ovarian cysts in the past. 10. PREGNANCY: "Is there any chance you are pregnant?" "When was your last menstrual period?"       No!  Protocols used: RECTAL BLEEDING-A-AH

## 2018-10-03 ENCOUNTER — Telehealth: Payer: Self-pay | Admitting: Family Medicine

## 2018-10-03 NOTE — Telephone Encounter (Signed)
Patient dropped off form to be filled out for her insurance. Patient had a physical in August.  It has to be done by 10/08/18. Patient said she just received the paperwork.  When paperwork is completed it can be faxed to [phone]. Form is in RX tower.

## 2018-10-06 NOTE — Telephone Encounter (Signed)
done

## 2018-10-06 NOTE — Telephone Encounter (Signed)
Form faxed to [phone] as requested.

## 2018-12-02 NOTE — Progress Notes (Signed)
Dr. Frederico Hamman T. Adriel Desrosier, MD, [facility] Primary Care and Sports Medicine [place], [address] Phone: [phone] Fax: [phone]  12/03/2018  Patient: Laura Gamble, MRN: [id number], DOB: [birth year], 28 y.o.  Primary Physician:  Owens Loffler, MD   Chief Complaint  Patient presents with  . Depression    Prozac no longer working   Subjective:   Laura Gamble is a 28 y.o. very pleasant female patient who presents with the following:  She is a well-known patient with longstanding anxiety and depression.  She also has some panic attacks.  Prozac has become less effective for her. Depression.  About 3 months ago.  Has been progressively getting worse, ten going in full spiral.  Most days.  In school full time. At the hospital most of the time.   Wants to sleep all the time.  Can't stand how she looks and swells on error. Anxiety is mostly better.   Wakes up 3-4 times a night and still breast feeding.  Stays - all interrupted sleep.  Before sex drive is OK. Has to remind to help baby. Low interest.  Feels guilty - mom and wife.  No SI or HI. Hates herself.   Pumps some. 2-3 times a day.  Past Medical History, Surgical History, Social History, Family History, Problem List, Medications, and Allergies have been reviewed and updated if relevant.  Patient Active Problem List   Diagnosis Date Noted  . Traumatic injury during pregnancy in third trimester 01/20/2018  . Common migraine 08/04/2013  . Major depression, recurrent ([place]) 07/01/2013  . Generalized anxiety disorder 04/28/2013  . Panic attack 12/10/2011    Past Medical History:  Diagnosis Date  . Depression 07/01/2013  . Generalized anxiety disorder 04/28/2013  . Panic attack     Past Surgical History:  Procedure Laterality Date  . NO PAST SURGERIES      Social History   Socioeconomic History  . Marital status: Married    Spouse name: Not on file  . Number of  children: Not on file  . Years of education: Not on file  . Highest education level: Not on file  Occupational History  . Not on file  Social Needs  . Financial resource strain: Not on file  . Food insecurity:    Worry: Not on file    Inability: Not on file  . Transportation needs:    Medical: Not on file    Non-medical: Not on file  Tobacco Use  . Smoking status: Never Smoker  . Smokeless tobacco: Never Used  Substance and Sexual Activity  . Alcohol use: No  . Drug use: No  . Sexual activity: Yes    Birth control/protection: None  Lifestyle  . Physical activity:    Days per week: Not on file    Minutes per session: Not on file  . Stress: Not on file  Relationships  . Social connections:    Talks on phone: Not on file    Gets together: Not on file    Attends religious service: Not on file    Active member of club or organization: Not on file    Attends meetings of clubs or organizations: Not on file    Relationship status: Not on file  . Intimate partner violence:    Fear of current or ex partner: Not on file    Emotionally abused: Not on file    Physically abused: Not on file    Forced sexual  activity: Not on file  Other Topics Concern  . Not on file  Social History Narrative   student    Family History  Problem Relation Age of Onset  . Anxiety disorder Mother   . Hypertension Mother   . Depression Mother   . Bipolar disorder Mother   . Depression Father   . Hypertension Maternal Grandfather   . Hypertension Maternal Aunt   . Hypertension Maternal Uncle   . Hypertension Maternal Grandmother     No Known Allergies  Medication list reviewed and updated in full in [place].   GEN: No acute illnesses, no fevers, chills. GI: No n/v/d, eating normally Pulm: No SOB Interactive and getting along well at home.  Otherwise, ROS is as per the HPI.  Objective:   BP 108/72   Pulse 78   Temp 98.3 F (36.8 C) (Oral)   Ht 5' 5.5" (1.664 m)   Wt 164  lb 8 oz (74.6 kg)   Breastfeeding Yes   BMI 26.96 kg/m   GEN: WDWN, NAD, Non-toxic, A & O x 3 HEENT: Atraumatic, Normocephalic. Neck supple. No masses, No LAD. Ears and Nose: No external deformity. CV: RRR, No M/G/R. No JVD. No thrill. No extra heart sounds. PULM: CTA B, no wheezes, crackles, rhonchi. No retractions. No resp. distress. No accessory muscle use. EXTR: No c/c/e NEURO Normal gait.  PSYCH: Normally interactive. Conversant. Calm demeanor.   Laboratory and Imaging Data:  Assessment and Plan:   Moderate episode of recurrent major depressive disorder (HCC)  Generalized anxiety disorder  Panic attack  >25 minutes spent in face to face time with patient, >50% spent in counselling or coordination of care   Primary issue today is worsening depression, particularly over the last 3 months or so ago.  She does have 2 small children at home and one made 20-month-old baby.  The baby is sleeping in her bed with her and her husband, and she is still getting up 3 or 4 times a night breast-feeding.  She is getting maximally 5 hours of interrupted sleep nightly, and she is doing clinicals for surgical tech school, often getting up at 5 AM and working 12-hour shifts.  Interest is decreased.  Feeling guilty.  Some apathy.  Energy is decreased.  She denies suicidality homicidality.  I think for her to improve and get better, she needs to make some significant changes at home.  Recommended that ideally she can have her youngest daughter sleep in the same room with her older daughter, and at 58 months of age, she really does not need to be breast-feeding 3 or 4 times nightly.  Hopefully, her youngest child will start to sleep through the night.  Again increase her Prozac to 80 mg daily with close follow-up.  Follow-up: Return in about 5 weeks (around 01/07/2019).  Meds ordered this encounter  Medications  . FLUoxetine (PROZAC) 40 MG capsule    Sig: Take 2 capsules (80 mg total) by mouth  daily.    Dispense:  180 capsule    Refill:  1   Signed,  Taurus Alamo T. Jacey Pelc, MD   Outpatient Encounter Medications as of 12/03/2018  Medication Sig  . FLUoxetine (PROZAC) 40 MG capsule Take 2 capsules (80 mg total) by mouth daily.  . [DISCONTINUED] FLUoxetine (PROZAC) 40 MG capsule Take 1 capsule (40 mg total) by mouth daily.   No facility-administered encounter medications on file as of 12/03/2018.

## 2018-12-03 ENCOUNTER — Ambulatory Visit: Payer: 59 | Admitting: Family Medicine

## 2018-12-03 ENCOUNTER — Encounter: Payer: Self-pay | Admitting: Family Medicine

## 2018-12-03 VITALS — BP 108/72 | HR 78 | Temp 98.3°F | Ht 65.5 in | Wt 164.5 lb

## 2018-12-03 DIAGNOSIS — F331 Major depressive disorder, recurrent, moderate: Secondary | ICD-10-CM | POA: Diagnosis not present

## 2018-12-03 DIAGNOSIS — F410 Panic disorder [episodic paroxysmal anxiety] without agoraphobia: Secondary | ICD-10-CM | POA: Diagnosis not present

## 2018-12-03 DIAGNOSIS — F411 Generalized anxiety disorder: Secondary | ICD-10-CM

## 2018-12-03 MED ORDER — FLUOXETINE HCL 40 MG PO CAPS: 80.0000 mg | ORAL_CAPSULE | Freq: Every day | ORAL | 1 refills | Status: DC

## 2018-12-15 ENCOUNTER — Other Ambulatory Visit: Payer: Self-pay | Admitting: Family Medicine

## 2019-01-01 ENCOUNTER — Encounter: Payer: Self-pay | Admitting: Family Medicine

## 2019-01-01 ENCOUNTER — Ambulatory Visit (INDEPENDENT_AMBULATORY_CARE_PROVIDER_SITE_OTHER): Payer: 59 | Admitting: Family Medicine

## 2019-01-01 ENCOUNTER — Other Ambulatory Visit: Payer: Self-pay

## 2019-01-01 DIAGNOSIS — R6889 Other general symptoms and signs: Secondary | ICD-10-CM

## 2019-01-01 DIAGNOSIS — R51 Headache: Secondary | ICD-10-CM | POA: Diagnosis not present

## 2019-01-01 DIAGNOSIS — R509 Fever, unspecified: Secondary | ICD-10-CM

## 2019-01-01 DIAGNOSIS — Z20822 Contact with and (suspected) exposure to covid-19: Secondary | ICD-10-CM

## 2019-01-01 DIAGNOSIS — Z20828 Contact with and (suspected) exposure to other viral communicable diseases: Secondary | ICD-10-CM

## 2019-01-01 NOTE — Progress Notes (Signed)
Laura Fellers T. Stclair Szymborski, MD Primary Care and [place] at [facility] [name], [address] Phone: [phone]  FAX: [name]  MRN [id number]  Date of Birth: 1991/02/02  Visit Date: 01/01/2019  PCP: Owens Loffler, MD  Referred by: Owens Loffler, MD  Virtual Visit via Video Note:  I connected with  Laura Gamble on 01/01/2019 11:00 AM EDT by a video enabled telemedicine application and verified that I am speaking with the correct person using two identifiers.   Location patient: home phone or cell phone Location provider: work or home office Consent: Verbal consent directly obtained from Laura Gamble. Persons participating in the virtual visit: patient, provider  I discussed the limitations of evaluation and management by telemedicine and the availability of in person appointments. The patient expressed understanding and agreed to proceed.  History of Present Illness:  Laura Gamble is a very well-known patient.  She generally is quite healthy, but she does have some underlying anxiety, she also has 2 young children at home who are 82 years old is well is 59 months old.  She is currently lactating and breast-feeding.  She does have some headache as well as some congestion and congestion in her lungs without frank mucus production as well as some significant sore throat.  She has been feeling hot and cold.  She did have some shortness of breath last night, but that is resolved.  She feels as if she has no energy and she has some decreased appetite.  She does have a temperature 99.8.  She has no underlying pulmonary disease and does not smoke.  She has not taken any underlying medications at this point.  She does describe an event where she was in [place], and a woman who was very close to her commented loudly that she had tested positive for COVID-19.  Review of Systems as above:  See pertinent positives and pertinent negatives per HPI No acute distress verbally  Past Medical History, Surgical History, Social History, Family History, Problem List, Medications, and Allergies have been reviewed and updated if relevant.   Observations/Objective/Exam:  An attempt was made to discern vital signs over the phone and per patient if applicable and possible.   General:    Alert, Oriented, appears well and in no acute distress HEENT:     Atraumatic, conjunctiva clear, no obvious abnormalities on inspection of external nose and ears.  Neck:    Normal movements of the head and neck Pulmonary:     On inspection no signs of respiratory distress, breathing rate appears normal, no obvious gross SOB, gasping or wheezing Cardiovascular:    No obvious cyanosis Musculoskeletal:    Moves all visible extremities without noticeable abnormality Psych / Neurological:     Pleasant and cooperative, no obvious depression or anxiety, speech and thought processing grossly intact  Discussed the following assessment and plan:  Flu-like symptoms  Exposure to Covid-19 Virus  She has symptoms that would be consistent with both influenza and potentially COVID-19.  She does have a known exposure.  In both cases with her healthy 28 year old status, would recommend self quarantine and supportive care.  She is already doing this.  Recommended that she take some Tylenol, continue to drink fluids, rest as able, we also discussed care of her children.  Her husband is going to do the best he can to care for them.  Also recommended that for now  she pump and get rid of her breast milk until she has been afebrile for a few days to decrease the risk of infection for her children.  She understands to go to the hospital if she is markedly worse and has some significant shortness of breath.  I discussed the assessment and treatment plan with the patient. The patient was provided an opportunity to ask  questions and all were answered. The patient agreed with the plan and demonstrated an understanding of the instructions.   The patient was advised to call back or seek an in-person evaluation if the symptoms worsen or if the condition fails to improve as anticipated.  I provided 25 minutes of face-to-face time via virtual video visit during this encounter   Follow-up: prn unless noted otherwise below No follow-ups on file.  Signed,  Maud Deed. Nyimah Shadduck, MD

## 2019-01-23 ENCOUNTER — Telehealth: Payer: Self-pay | Admitting: *Deleted

## 2019-01-23 NOTE — Telephone Encounter (Signed)
Patient left a voicemail stating that she is on the highest dose of Prozac that she can take. Patient stated that the medication is not working at all and may even be making her anxiety/depression worse. Patient stated that she needs something else prescribed for her.

## 2019-01-26 MED ORDER — BUSPIRONE HCL 5 MG PO TABS: 5.0000 mg | ORAL_TABLET | Freq: Two times a day (BID) | ORAL | 1 refills | Status: DC

## 2019-01-26 NOTE — Telephone Encounter (Signed)
Please call  I want Laura Gamble to cut her prozac dose down to only 1 capsule a day  I would like to start her on buspar, which is totally unrelated to prozac and SSRI's It will probably help much faster - not addictive at all.  I would start at low dose Buspar 5 mg, 1 po BID, #60, 1 ref  F/u doxy visit with me in 1 month, if everything is clear by then, then we can do face to face

## 2019-01-26 NOTE — Telephone Encounter (Signed)
Laurynn notified as instructed by telephone.  Buspar 5 mg prescription sent to [facility] in [place]. [facility] appointment scheduled for 02/23/2019 at 9:20 am with Dr. Lorelei Pont.

## 2019-01-26 NOTE — Telephone Encounter (Signed)
Left message for Laura Gamble to return my call

## 2019-02-17 ENCOUNTER — Other Ambulatory Visit: Payer: Self-pay | Admitting: Family Medicine

## 2019-02-23 ENCOUNTER — Encounter: Payer: Self-pay | Admitting: Family Medicine

## 2019-02-23 ENCOUNTER — Ambulatory Visit (INDEPENDENT_AMBULATORY_CARE_PROVIDER_SITE_OTHER): Payer: 59 | Admitting: Family Medicine

## 2019-02-23 VITALS — Ht 65.5 in

## 2019-02-23 DIAGNOSIS — F331 Major depressive disorder, recurrent, moderate: Secondary | ICD-10-CM

## 2019-02-23 DIAGNOSIS — F411 Generalized anxiety disorder: Secondary | ICD-10-CM

## 2019-02-23 MED ORDER — BUSPIRONE HCL 10 MG PO TABS: 10.0000 mg | ORAL_TABLET | Freq: Two times a day (BID) | ORAL | 2 refills | Status: DC

## 2019-02-23 NOTE — Progress Notes (Signed)
Laura Gamble T. Isyss Espinal, MD Primary Care and [place] at [facility] [place], [address] Phone: [phone]  FAX: [place] - 28 y.o. female  MRN [id number]  Date of Birth: June 14, 1991  Visit Date: 02/23/2019  PCP: Owens Loffler, MD  Referred by: Owens Loffler, MD  Cc: f/u dep  Virtual Visit via Video Note:  I connected with  Melina Fiddler on 02/23/2019  9:20 AM EDT by a video enabled telemedicine application and verified that I am speaking with the correct person using two identifiers.   Location patient: home computer, tablet, or smartphone Location provider: work or home office Consent: Verbal consent directly obtained from [facility]. Persons participating in the virtual visit: patient, provider  I discussed the limitations of evaluation and management by telemedicine and the availability of in person appointments. The patient expressed understanding and agreed to proceed.  History of Present Illness:  Dep: Her depression has gotten quite a bit worse in the last 2 or 3 weeks.  She is having some crying spells as well as anhedonia.  Her interest and energy has gotten down quite a bit and she is somewhat guilty.  She is not having any suicidality or homicidality.  I did decrease her Prozac dose about 4 weeks ago.  She was having some anxiety that was worsening at the time, so I added BuSpar, she is now taking this 5 mg p.o. twice daily.  Anxiety  BF  Review of Systems as above: See pertinent positives and pertinent negatives per HPI No acute distress verbally  Past Medical History, Surgical History, Social History, Family History, Problem List, Medications, and Allergies have been reviewed and updated if relevant.   Observations/Objective/Exam:  An attempt was made to discern vital signs over the phone and per patient if applicable and possible.   General:    Alert,  Oriented, appears well and in no acute distress HEENT:     Atraumatic, conjunctiva clear, no obvious abnormalities on inspection of external nose and ears.  Neck:    Normal movements of the head and neck Pulmonary:     On inspection no signs of respiratory distress, breathing rate appears normal, no obvious gross SOB, gasping or wheezing Cardiovascular:    No obvious cyanosis Musculoskeletal:    Moves all visible extremities without noticeable abnormality Psych / Neurological:     Pleasant and cooperative, no obvious depression or anxiety, speech and thought processing grossly intact  Assessment and Plan:  Moderate episode of recurrent major depressive disorder (HCC)  Generalized anxiety disorder  >15 minutes spent in face to face time with patient, >50% spent in counselling or coordination of care   Resume Prozac 80 mg.  Increase BuSpar to 10 mg p.o. twice daily.  Possible that lowering Prozac may have worsened her depression.  Her mother-in-law also died 2 weeks ago.  Follow-up by MyChart 2 weeks, follow-up video visit in 4 to 5 weeks.  I discussed the assessment and treatment plan with the patient. The patient was provided an opportunity to ask questions and all were answered. The patient agreed with the plan and demonstrated an understanding of the instructions.   The patient was advised to call back or seek an in-person evaluation if the symptoms worsen or if the condition fails to improve as anticipated.  Follow-up: prn unless noted otherwise below No follow-ups on file.  Meds ordered this encounter  Medications  . busPIRone (BUSPAR)  10 MG tablet    Sig: Take 1 tablet (10 mg total) by mouth 2 (two) times daily.    Dispense:  60 tablet    Refill:  2   No orders of the defined types were placed in this encounter.   Signed,  Maud Deed. Darielle Hancher, MD

## 2019-03-11 ENCOUNTER — Encounter: Payer: Self-pay | Admitting: Family Medicine

## 2019-03-11 NOTE — Telephone Encounter (Signed)
Can you get Laura Gamble a face to face appointment.  We have talked a few times about anxiety, depression, now stomach issues, and would be helpful to talk to her in the office.   I do want her to get her mom or someone to watch her kids and not bring them into the office.

## 2019-03-12 ENCOUNTER — Ambulatory Visit: Payer: 59 | Admitting: Family Medicine

## 2019-03-12 NOTE — Telephone Encounter (Signed)
Left message asking pt to call @@see  dr copland message

## 2019-03-12 NOTE — Telephone Encounter (Signed)
Appointment 6/4

## 2019-03-15 NOTE — Progress Notes (Deleted)
Laura [place]haska T. [name], MD Primary Care and [place] at [facility] [place], [address] Phone: [phone]  FAX: [place] - 28 y.o. female  MRN [id number]  Date of Birth: 01/06/1991  Visit Date: 03/16/2019  PCP: Owens Loffler, MD  Referred by: Owens Loffler, MD  No chief complaint on file.  Subjective:   Laura Gamble is a 28 y.o. very pleasant female patient who presents with the following:  F/u on her anxiety, depression, and some ongoing stomach problems.   Currently on Prozac 80 mg and Buspar 10 mg BID.  Past Medical History, Surgical History, Social History, Family History, Problem List, Medications, and Allergies have been reviewed and updated if relevant.  Patient Active Problem List   Diagnosis Date Noted  . Traumatic injury during pregnancy in third trimester 01/20/2018  . Common migraine 08/04/2013  . Major depression, recurrent ([place]) 07/01/2013  . Generalized anxiety disorder 04/28/2013  . Panic attack 12/10/2011    Past Medical History:  Diagnosis Date  . Depression 07/01/2013  . Generalized anxiety disorder 04/28/2013  . Panic attack     Past Surgical History:  Procedure Laterality Date  . NO PAST SURGERIES      Social History   Socioeconomic History  . Marital status: Married    Spouse name: Not on file  . Number of children: Not on file  . Years of education: Not on file  . Highest education level: Not on file  Occupational History  . Not on file  Social Needs  . Financial resource strain: Not on file  . Food insecurity:    Worry: Not on file    Inability: Not on file  . Transportation needs:    Medical: Not on file    Non-medical: Not on file  Tobacco Use  . Smoking status: Never Smoker  . Smokeless tobacco: Never Used  Substance and Sexual Activity  . Alcohol use: No  . Drug use: No  . Sexual activity: Yes    Birth control/protection:  None  Lifestyle  . Physical activity:    Days per week: Not on file    Minutes per session: Not on file  . Stress: Not on file  Relationships  . Social connections:    Talks on phone: Not on file    Gets together: Not on file    Attends religious service: Not on file    Active member of club or organization: Not on file    Attends meetings of clubs or organizations: Not on file    Relationship status: Not on file  . Intimate partner violence:    Fear of current or ex partner: Not on file    Emotionally abused: Not on file    Physically abused: Not on file    Forced sexual activity: Not on file  Other Topics Concern  . Not on file  Social History Narrative   student    Family History  Problem Relation Age of Onset  . Anxiety disorder Mother   . Hypertension Mother   . Depression Mother   . Bipolar disorder Mother   . Depression Father   . Hypertension Maternal Grandfather   . Hypertension Maternal Aunt   . Hypertension Maternal Uncle   . Hypertension Maternal Grandmother     No Known Allergies  Medication list reviewed and updated in full in [facility] [facility].   GEN: No acute illnesses, no fevers, chills.  GI: No n/v/d, eating normally Pulm: No SOB Interactive and getting along well at home.  Otherwise, ROS is as per the HPI.  Objective:   There were no vitals taken for this visit.  GEN: WDWN, NAD, Non-toxic, A & O x 3 HEENT: Atraumatic, Normocephalic. Neck supple. No masses, No LAD. Ears and Nose: No external deformity. CV: RRR, No M/G/R. No JVD. No thrill. No extra heart sounds. PULM: CTA B, no wheezes, crackles, rhonchi. No retractions. No resp. distress. No accessory muscle use. EXTR: No c/c/e NEURO Normal gait.  PSYCH: Normally interactive. Conversant. Not depressed or anxious appearing.  Calm demeanor.   Laboratory and Imaging Data:  Assessment and Plan:   ***

## 2019-03-16 ENCOUNTER — Ambulatory Visit: Payer: 59 | Admitting: Family Medicine

## 2019-03-20 ENCOUNTER — Other Ambulatory Visit: Payer: Self-pay | Admitting: Family Medicine

## 2019-03-23 ENCOUNTER — Telehealth: Payer: Self-pay

## 2019-03-23 ENCOUNTER — Ambulatory Visit (INDEPENDENT_AMBULATORY_CARE_PROVIDER_SITE_OTHER): Payer: 59 | Admitting: Internal Medicine

## 2019-03-23 ENCOUNTER — Telehealth: Payer: Self-pay | Admitting: Internal Medicine

## 2019-03-23 ENCOUNTER — Encounter: Payer: Self-pay | Admitting: Internal Medicine

## 2019-03-23 ENCOUNTER — Other Ambulatory Visit: Payer: 59

## 2019-03-23 ENCOUNTER — Encounter: Payer: Self-pay | Admitting: Physician Assistant

## 2019-03-23 ENCOUNTER — Telehealth: Payer: 59 | Admitting: Physician Assistant

## 2019-03-23 ENCOUNTER — Encounter (INDEPENDENT_AMBULATORY_CARE_PROVIDER_SITE_OTHER): Payer: Self-pay

## 2019-03-23 ENCOUNTER — Other Ambulatory Visit: Payer: Self-pay

## 2019-03-23 VITALS — Temp 99.7°F

## 2019-03-23 DIAGNOSIS — R05 Cough: Secondary | ICD-10-CM

## 2019-03-23 DIAGNOSIS — R0981 Nasal congestion: Secondary | ICD-10-CM | POA: Diagnosis not present

## 2019-03-23 DIAGNOSIS — J029 Acute pharyngitis, unspecified: Secondary | ICD-10-CM

## 2019-03-23 DIAGNOSIS — G4489 Other headache syndrome: Secondary | ICD-10-CM

## 2019-03-23 DIAGNOSIS — R0789 Other chest pain: Secondary | ICD-10-CM

## 2019-03-23 DIAGNOSIS — R059 Cough, unspecified: Secondary | ICD-10-CM

## 2019-03-23 DIAGNOSIS — R509 Fever, unspecified: Secondary | ICD-10-CM

## 2019-03-23 DIAGNOSIS — Z20822 Contact with and (suspected) exposure to covid-19: Secondary | ICD-10-CM

## 2019-03-23 MED ORDER — AMOXICILLIN 500 MG PO CAPS: 500.0000 mg | ORAL_CAPSULE | Freq: Three times a day (TID) | ORAL | 0 refills | Status: AC

## 2019-03-23 NOTE — Addendum Note (Signed)
Addended by: Lurlean Nanny on: 03/23/2019 02:28 PM   Modules accepted: Orders

## 2019-03-23 NOTE — Telephone Encounter (Signed)
Call received from office staff member [facility] CMA. Pt was sent to [facility] testing site without order and appointment. NT contacted patient and scheduled appointment at [facility] site tomorrow AM.

## 2019-03-23 NOTE — Telephone Encounter (Signed)
Patient stated that since she is going to be tested for COVID her husband is unable to go back to work until her test comes back negative. Her husband's work is needing some time of letter from her doctor stating this is why he is needing to be out  [employer] # [id number]

## 2019-03-23 NOTE — Patient Instructions (Signed)
° ° °  Person Under Monitoring Name: Laura Gamble  Location: [address] [place] [address]   CORONAVIRUS DISEASE 2019 (COVID-19) Guidance for Persons Under Investigation You are being tested for the virus that causes coronavirus disease 2019 (COVID-19). Public health actions are necessary to ensure protection of your health and the health of others, and to prevent further spread of infection. COVID-19 is caused by a virus that can cause symptoms, such as fever, cough, and shortness of breath. The primary transmission from person to person is by coughing or sneezing. On November 06, 2018, the [place] announced a [employer] Emergency of International Concern and on November 07, 2018 the U.S. Department of Health and Human Services declared a public health emergency. If the virus that causesCOVID-19 spreads in the community, it could have severe public health consequences.  As a person under investigation for COVID-19, the [place] advises you to adhere to the following guidance until your test results are reported to you. If your test result is positive, you will receive additional information from your provider and your local health department at that time.   Remain at home until you are cleared by your health provider or public health authorities.   Keep a log of visitors to your home using the form provided. Any visitors to your home must be aware of your isolation status.  If you plan to move to a new address or leave the county, notify the local health department in your county.  Call a doctor or seek care if you have an urgent medical need. Before seeking medical care, call ahead and get instructions from the provider before arriving at the medical office, clinic or hospital. Notify them that you are being tested for the virus that causes COVID-19 so arrangements can be made, as necessary,  to prevent transmission to others in the healthcare setting. Next, notify the local health department in your county.  If a medical emergency arises and you need to call 911, inform the first responders that you are being tested for the virus that causes COVID-19. Next, notify the local health department in your county.  Adhere to all guidance set forth by the [place] for [facility] of patients that is based on guidance from the Center for Disease Control and Prevention with suspected or confirmed COVID-19. It is provided with this guidance for Persons Under Investigation.  Your health and the health of our community are our top priorities. Public Health officials remain available to provide assistance and counseling to you about COVID-19 and compliance with this guidance.  Provider: ____________________________________________________________ Date: ______/_____/_________  By signing below, you acknowledge that you have read and agree to comply with this Guidance for Persons Under Investigation. ______________________________________________________________ Date: ______/_____/_________  WHO DO I CALL? You can find a list of local health departments here: [email or web address] Health Department: ____________________________________________________________________ Contact Name: ________________________________________________________________________ Telephone: ___________________________________________________________________________  Marice Potter, [place], Communicable Disease Branch COVID-19 Guidance for Persons Under Investigation December 13, 2018

## 2019-03-23 NOTE — Telephone Encounter (Signed)
Ok for note 

## 2019-03-23 NOTE — Progress Notes (Signed)
Virtual Visit via Video Note  I connected with Laura Gamble on 03/23/19 at  2:00 PM EDT by a video enabled telemedicine application and verified that I am speaking with the correct person using two identifiers.  Location: Patient: Home Provider: Office   I discussed the limitations of evaluation and management by telemedicine and the availability of in person appointments. The patient expressed understanding and agreed to proceed.  History of Present Illness:  Pt reports nasal congestion, sore throat and cough. This started 5 days ago. She is blowing green mucous out of her nose. She denies difficulty swallowing. The cough is non productive. She denies headaches, runny nose, ear pain or shortness of breath. She reports fever up to 99.7, but denies chills or body aches. She has taken Ibuprofen with minimal relief. She has not had sick contacts that she is aware of.    Past Medical History:  Diagnosis Date  . Depression 07/01/2013  . Generalized anxiety disorder 04/28/2013  . Panic attack     Current Outpatient Medications  Medication Sig Dispense Refill  . busPIRone (BUSPAR) 10 MG tablet TAKE 1 TABLET BY MOUTH TWICE A DAY 180 tablet 0  . FLUoxetine (PROZAC) 40 MG capsule Take 2 capsules (80 mg total) by mouth daily. 180 capsule 1   No current facility-administered medications for this visit.     No Known Allergies  Family History  Problem Relation Age of Onset  . Anxiety disorder Mother   . Hypertension Mother   . Depression Mother   . Bipolar disorder Mother   . Depression Father   . Hypertension Maternal Grandfather   . Hypertension Maternal Aunt   . Hypertension Maternal Uncle   . Hypertension Maternal Grandmother     Social History   Socioeconomic History  . Marital status: Married    Spouse name: Not on file  . Number of children: Not on file  . Years of education: Not on file  . Highest education level: Not on file  Occupational History  . Not on file   Social Needs  . Financial resource strain: Not on file  . Food insecurity    Worry: Not on file    Inability: Not on file  . Transportation needs    Medical: Not on file    Non-medical: Not on file  Tobacco Use  . Smoking status: Never Smoker  . Smokeless tobacco: Never Used  Substance and Sexual Activity  . Alcohol use: No  . Drug use: No  . Sexual activity: Yes    Birth control/protection: None  Lifestyle  . Physical activity    Days per week: Not on file    Minutes per session: Not on file  . Stress: Not on file  Relationships  . Social Herbalist on phone: Not on file    Gets together: Not on file    Attends religious service: Not on file    Active member of club or organization: Not on file    Attends meetings of clubs or organizations: Not on file    Relationship status: Not on file  . Intimate partner violence    Fear of current or ex partner: Not on file    Emotionally abused: Not on file    Physically abused: Not on file    Forced sexual activity: Not on file  Other Topics Concern  . Not on file  Social History Narrative   student     Constitutional: Pt reports fever.  Denies malaise, fatigue, headache or abrupt weight changes.  HEENT: Pt reports nasal congestion, sore throat. Denies eye pain, eye redness, ear pain, ringing in the ears, wax buildup, runny nose, bloody nose. Respiratory: Pt reports cough. Denies difficulty breathing, shortness of breath, or sputum production.   Cardiovascular: Denies chest pain, chest tightness, palpitations or swelling in the hands or feet.  Skin: Denies redness, rashes, lesions or ulcercations.   No other specific complaints in a complete review of systems (except as listed in HPI above).  Temp 99.7 F (37.6 C) (Oral)  Wt Readings from Last 3 Encounters:  12/03/18 164 lb 8 oz (74.6 kg)  05/28/18 171 lb (77.6 kg)  03/24/18 191 lb (86.6 kg)    General: Appears her stated age, mildly ill appearing, in  NAD. Skin: Warm, dry and intact.  HEENT: Head: normal shape and size; Eyes: sclera white and EOMs intact;  Pulmonary/Chest: Normal effort. No respiratory distress.  Neurological: Alert and oriented.     BMET    Component Value Date/Time   NA 141 05/28/2018 1524   K 4.2 05/28/2018 1524   CL 105 05/28/2018 1524   CO2 28 05/28/2018 1524   GLUCOSE 83 05/28/2018 1524   BUN 9 05/28/2018 1524   CREATININE 0.82 05/28/2018 1524   CALCIUM 9.6 05/28/2018 1524   GFRNONAA >60 02/28/2018 0132   GFRAA >60 02/28/2018 0132    Lipid Panel     Component Value Date/Time   CHOL 199 05/28/2018 1524   TRIG 125.0 05/28/2018 1524   HDL 52.70 05/28/2018 1524   CHOLHDL 4 05/28/2018 1524   VLDL 25.0 05/28/2018 1524   LDLCALC 121 (H) 05/28/2018 1524    CBC    Component Value Date/Time   WBC 6.5 05/28/2018 1524   RBC 4.39 05/28/2018 1524   HGB 12.9 05/28/2018 1524   HCT 38.4 05/28/2018 1524   PLT 309.0 05/28/2018 1524   MCV 87.3 05/28/2018 1524   MCH 30.8 03/25/2018 0646   MCHC 33.5 05/28/2018 1524   RDW 12.5 05/28/2018 1524   LYMPHSABS 2.4 05/28/2018 1524   MONOABS 0.4 05/28/2018 1524   EOSABS 0.1 05/28/2018 1524   BASOSABS 0.0 05/28/2018 1524    Hgb A1C No results found for: HGBA1C      Assessment and Plan:  Fever, Nasal Congestion, Sore Throat, Cough:  Will have her proceed to a drive up test site for COVID 19 testing Start Zyrtec and Flonase OTC RX for Amoxil 500 mg TID x 10 days Self quarantine until COVID testing comes back Delsym as needed for cough  Will follow up after labs, return precautions discussed  Follow Up Instructions:    I discussed the assessment and treatment plan with the patient. The patient was provided an opportunity to ask questions and all were answered. The patient agreed with the plan and demonstrated an understanding of the instructions.   The patient was advised to call back or seek an in-person evaluation if the symptoms worsen or if the  condition fails to improve as anticipated.    Webb Silversmith, NP

## 2019-03-23 NOTE — Addendum Note (Signed)
Addended by: Lurlean Nanny on: 03/23/2019 02:25 PM   Modules accepted: Orders

## 2019-03-23 NOTE — Progress Notes (Signed)
E-Visit for Corona Virus Screening   Your current symptoms could be consistent with the coronavirus.  Call your health care provider or local health department to request and arrange formal testing. Many health care providers can now test patients at their office but not all are.  Please quarantine yourself while awaiting your test results.  [place] [phone], [place], [place] [phone] or visit [email or web address]  and You have been enrolled in [place] for COVID-19.  Daily you will receive a questionnaire within the [place] website. Our COVID-19 response team will be monitoring your responses daily.  Please call the provided number for testing.  I have provided a work note You stated that you have "pounding" in your chest, if you are experiencing any chest pain, please go to an urgent care or go to the ER   COVID-19 is a respiratory illness with symptoms that are similar to the flu. Symptoms are typically mild to moderate, but there have been cases of severe illness and death due to the virus. The following symptoms may appear 2-14 days after exposure: . Fever . Cough . Shortness of breath or difficulty breathing . Chills . Repeated shaking with chills . Muscle pain . Headache . Sore throat . New loss of taste or smell . Fatigue . Congestion or runny nose . Nausea or vomiting . Diarrhea  It is vitally important that if you feel that you have an infection such as this virus or any other virus that you stay home and away from places where you may spread it to others.  You should self-quarantine for 14 days if you have symptoms that could potentially be coronavirus or have been in close contact a with a person diagnosed with COVID-19 within the last 2 weeks. You should avoid contact with people age 21 and older.   You should  wear a mask or cloth face covering over your nose and mouth if you must be around other people or animals, including pets (even at home). Try to stay at least 6 feet away from other people. This will protect the people around you.  You can use medication such as A prescription cough medication called Tessalon Perles 100 mg. You may take 1-2 capsules every 8 hours as needed for cough  You may also take acetaminophen (Tylenol) as needed for fever.   Reduce your risk of any infection by using the same precautions used for avoiding the common cold or flu:  [place] Kitchen Wash your hands often with soap and warm water for at least 20 seconds.  If soap and water are not readily available, use an alcohol-based hand sanitizer with at least 60% alcohol.  . If coughing or sneezing, cover your mouth and nose by coughing or sneezing into the elbow areas of your shirt or coat, into a tissue or into your sleeve (not your hands). . Avoid shaking hands with others and consider head nods or verbal greetings only. . Avoid touching your eyes, nose, or mouth with unwashed hands.  . Avoid close contact with people who are sick. . Avoid places or events with large numbers of people in one location, like concerts or sporting events. . Carefully consider travel plans you have or are making. . If you are planning any travel outside or inside the [place], visit the CDC's Travelers' Health webpage for the latest health notices. . If you have some symptoms but not all symptoms, continue to monitor at home  and seek medical attention if your symptoms worsen. . If you are having a medical emergency, call 911.  HOME CARE . Only take medications as instructed by your medical team. . Drink plenty of fluids and get plenty of rest. . A steam or ultrasonic humidifier can help if you have congestion.   GET HELP RIGHT AWAY IF YOU HAVE EMERGENCY WARNING SIGNS** FOR COVID-19. If you or someone is showing any of these signs seek emergency medical care  immediately. Call 911 or proceed to your closest emergency facility if: . You develop worsening high fever. . Trouble breathing . Bluish lips or face . Persistent pain or pressure in the chest . New confusion . Inability to wake or stay awake . You cough up blood. . Your symptoms become more severe  **This list is not all possible symptoms. Contact your medical provider for any symptoms that are sever or concerning to you.   MAKE SURE YOU   Understand these instructions.  Will watch your condition.  Will get help right away if you are not doing well or get worse.  Your e-visit answers were reviewed by a board certified advanced clinical practitioner to complete your personal care plan.  Depending on the condition, your plan could have included both over the counter or prescription medications.  If there is a problem please reply once you have received a response from your provider.  Your safety is important to [place].  If you have drug allergies check your prescription carefully.    You can use MyChart to ask questions about today's visit, request a non-urgent call back, or ask for a work or school excuse for 24 hours related to this e-Visit. If it has been greater than 24 hours you will need to follow up with your provider, or enter a new e-Visit to address those concerns. You will get an e-mail in the next two days asking about your experience.  I hope that your e-visit has been valuable and will speed your recovery. Thank you for using e-visits.   I spent 5-10 minutes on review and completion of this note- Lacy Duverney [facility]

## 2019-03-24 ENCOUNTER — Encounter (INDEPENDENT_AMBULATORY_CARE_PROVIDER_SITE_OTHER): Payer: Self-pay

## 2019-03-24 ENCOUNTER — Other Ambulatory Visit: Payer: 59

## 2019-03-24 ENCOUNTER — Other Ambulatory Visit: Payer: Self-pay | Admitting: Family Medicine

## 2019-03-24 ENCOUNTER — Telehealth: Payer: Self-pay | Admitting: *Deleted

## 2019-03-24 NOTE — Telephone Encounter (Signed)
Completely agree with POC

## 2019-03-24 NOTE — Telephone Encounter (Signed)
The pt called back; she states that her cough was worse when she got up, but has gotten better; discussed with pt recommendations made per Webb Silversmith on 03/23/2019 (zyrtec and flonase OTC); she states that she will pick them up; other recommendations:  COUGH MEDICINES:  * OTC COUGH SYRUPS: The most common cough suppressant in OTC cough medications is dextromethorphan. Often the letters 'DM' appear in the name.  * OTC COUGH DROPS: Cough drops can help a lot, especially for mild coughs. They reduce coughing by soothing your irritated throat and removing that tickle sensation in the back of the throat.; she verbalized understanding; will route to provider for notification.

## 2019-03-24 NOTE — Telephone Encounter (Signed)
Attempted to contact pt due to my chart companion response 03/24/2019 of worsening cough; left message on voicemail.

## 2019-03-25 ENCOUNTER — Encounter (INDEPENDENT_AMBULATORY_CARE_PROVIDER_SITE_OTHER): Payer: Self-pay

## 2019-03-25 ENCOUNTER — Telehealth: Payer: Self-pay | Admitting: Internal Medicine

## 2019-03-25 LAB — NOVEL CORONAVIRUS, NAA: SARS-CoV-2, NAA: NOT DETECTED

## 2019-03-25 NOTE — Telephone Encounter (Signed)
Patient called to check in on results from [facility] test She was not sure if it will come to [place] first or if she will be contacted by the facility who tested her.

## 2019-03-25 NOTE — Telephone Encounter (Signed)
Should come to [place], but have just heard that they are having issues getting the results to the correct place.

## 2019-07-06 ENCOUNTER — Other Ambulatory Visit: Payer: Self-pay | Admitting: Family Medicine

## 2019-07-06 NOTE — Telephone Encounter (Signed)
Last office visit 03/23/2019 with R. Garnette Gunner for Nasal Congestion.  Last visit with PCP 02/23/2019 which office notes states: Follow-up by MyChart 2 weeks, follow-up video visit in 4 to 5 weeks. Appointments cancelled on 03/12/2019 and 03/16/2019. Last refilled 12/03/2018 for #180 with 1 refill.   No future appointments.  Ok to refill?

## 2020-02-17 ENCOUNTER — Other Ambulatory Visit: Payer: Self-pay | Admitting: Family Medicine

## 2020-02-17 NOTE — Telephone Encounter (Signed)
Spoke with [place].  She states she had a miscarriage a couple weeks back and that is why she has been seeing her OB-GYN. But she states that she is doing a little better.   She also states that she will have to change PCPs because her insurance requires that she see someone in the [facility] and she wanted Dr. Lorelei Pont to know she was sorry she has to change PCPs.  I did go ahead and refilled her fluoxetine for a 90 day supply until she cab re-establishes care with new doctor.  FYI to Dr. Lorelei Pont.

## 2020-02-17 NOTE — Telephone Encounter (Signed)
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

## 2020-02-17 NOTE — Telephone Encounter (Signed)
Left message for Lorrinda to return my call
